# Patient Record
Sex: Male | Born: 1970
Health system: Southern US, Community
[De-identification: ages and names within clinical notes are randomized; demographics above are authoritative.]

## PROBLEM LIST (undated history)

## (undated) DIAGNOSIS — E119 Type 2 diabetes mellitus without complications: Secondary | ICD-10-CM

## (undated) DIAGNOSIS — H269 Unspecified cataract: Secondary | ICD-10-CM

## (undated) DIAGNOSIS — I10 Essential (primary) hypertension: Secondary | ICD-10-CM

## (undated) HISTORY — DX: Unspecified cataract: H26.9

## (undated) HISTORY — PX: CATARACT EXTRACTION: SUR2

---

## 1998-07-27 ENCOUNTER — Encounter: Payer: Self-pay | Admitting: Family Medicine

## 1998-07-27 ENCOUNTER — Ambulatory Visit (HOSPITAL_COMMUNITY): Admission: RE | Admit: 1998-07-27 | Discharge: 1998-07-27 | Payer: Self-pay | Admitting: Family Medicine

## 1999-03-18 ENCOUNTER — Emergency Department (HOSPITAL_COMMUNITY): Admission: EM | Admit: 1999-03-18 | Discharge: 1999-03-18 | Payer: Self-pay | Admitting: Emergency Medicine

## 2003-09-13 ENCOUNTER — Emergency Department (HOSPITAL_COMMUNITY): Admission: EM | Admit: 2003-09-13 | Discharge: 2003-09-13 | Payer: Self-pay | Admitting: Emergency Medicine

## 2008-01-16 ENCOUNTER — Emergency Department (HOSPITAL_COMMUNITY): Admission: EM | Admit: 2008-01-16 | Discharge: 2008-01-16 | Payer: Self-pay | Admitting: Emergency Medicine

## 2012-02-18 ENCOUNTER — Emergency Department (INDEPENDENT_AMBULATORY_CARE_PROVIDER_SITE_OTHER)
Admission: EM | Admit: 2012-02-18 | Discharge: 2012-02-18 | Disposition: A | Discharge status: Home / Self Care | Payer: Self-pay | Source: Home / Self Care | Attending: Family Medicine | Admitting: Family Medicine

## 2012-02-18 ENCOUNTER — Encounter (HOSPITAL_COMMUNITY): Payer: Self-pay | Admitting: *Deleted

## 2012-02-18 DIAGNOSIS — L309 Dermatitis, unspecified: Secondary | ICD-10-CM

## 2012-02-18 DIAGNOSIS — L259 Unspecified contact dermatitis, unspecified cause: Secondary | ICD-10-CM

## 2012-02-18 DIAGNOSIS — R209 Unspecified disturbances of skin sensation: Secondary | ICD-10-CM

## 2012-02-18 LAB — GLUCOSE, CAPILLARY: Glucose-Capillary: 145 mg/dL — ABNORMAL HIGH (ref 70–99)

## 2012-02-18 MED ORDER — TRIAMCINOLONE 0.1 % CREAM:EUCERIN CREAM 1:1: 1.0000 "application " | TOPICAL_CREAM | Freq: Two times a day (BID) | CUTANEOUS | Status: DC

## 2012-02-18 MED ORDER — TRIAMCINOLONE ACETONIDE 0.5 % EX OINT: TOPICAL_OINTMENT | Freq: Two times a day (BID) | CUTANEOUS | Status: DC

## 2012-02-18 MED ORDER — HYDROXYZINE HCL 25 MG PO TABS: 25.0000 mg | ORAL_TABLET | Freq: Four times a day (QID) | ORAL | Status: DC

## 2012-02-18 NOTE — ED Provider Notes (Signed)
History     CSN: [id number]  Arrival date & time 02/18/12  1224   First MD Initiated Contact with Patient 02/18/12 1320      Chief Complaint  Patient presents with  . Rash    (Consider location/radiation/quality/duration/timing/severity/associated sxs/prior treatment) HPI Comments: 41 y/o male with h/o obesity otherwise denies known PMH. Here c/o generalized body rash with itchiness and dry skin for months. Rash worse in arms upper thighs and abdomen. Has taken benadryl with no significant relief. Denies fatigue or general malaise, no fever, chills or night sweats. Also denies constipation, diarrhea or abdominal pain.  Denies h/o asthma or wheezing. Reports he has noted intermittent tingling and numbing sensation at the tip of fingers in both hands. States he currently does not have tingling or numbness in hands. Denies history of extremity weakness. Patient reports he has seen a PCP in the past and has never been diagnosed with diabetes, has not seen PCP in years. POC CBG random today is in 140's range. Denies polyuria, polydipsia or polyphagia.    History reviewed. No pertinent past medical history.  History reviewed. No pertinent past surgical history.  Family History  Problem Relation Age of Onset  . Diabetes Mother   . Hypertension Mother     History  Substance Use Topics  . Smoking status: Current Every Day Smoker  . Smokeless tobacco: Not on file  . Alcohol Use: Yes      Review of Systems  Constitutional: Negative for fever, chills, diaphoresis, appetite change and fatigue.       10 systems reviewed and  pertinent negative and positive symptoms are as per HPI.     HENT: Negative for congestion and rhinorrhea.   Eyes: Negative for pain.  Respiratory: Negative for shortness of breath and wheezing.   Gastrointestinal: Negative for nausea, vomiting, abdominal pain, diarrhea and constipation.  Skin: Positive for rash.       As per HPI  Neurological: Negative for  dizziness, seizures, weakness, light-headedness and headaches.  Psychiatric/Behavioral: The patient is not nervous/anxious.   All other systems reviewed and are negative.    Allergies  Review of patient's allergies indicates not on file.  Home Medications   Current Outpatient Rx  Name Route Sig Dispense Refill  . HYDROXYZINE HCL 25 MG PO TABS Oral Take 1 tablet (25 mg total) by mouth every 6 (six) hours. 12 tablet 0  . TRIAMCINOLONE 0.1 % CREAM:EUCERIN CREAM 1:1 Topical Apply 1 application topically 2 (two) times daily. Apply as instructed twice a day when necessary for eczema flareups.  triamcinolone 0.1% compounded with use during green 1:1 Dispense 450 g 1 each 0  . TRIAMCINOLONE ACETONIDE 0.5 % EX OINT Topical Apply topically 2 (two) times daily. 30 g 0    BP 137/77  Pulse 74  Temp 98.1 F (36.7 C) (Oral)  Resp 20  SpO2 100%  Physical Exam  Nursing note and vitals reviewed. Constitutional: He is oriented to person, place, and time. No distress.       Morbidly obese  HENT:  Head: Normocephalic and atraumatic.  Mouth/Throat: Oropharynx is clear and moist.  Eyes: Conjunctivae normal are normal. No scleral icterus.  Neck:       Obese neck no obvious thyromegaly.  Cardiovascular: Normal rate and regular rhythm.   Pulmonary/Chest: Breath sounds normal. He has no wheezes.  Abdominal: Soft. Bowel sounds are normal. There is no tenderness.  Lymphadenopathy:    He has no cervical adenopathy.    He has  no axillary adenopathy.       Right: No inguinal and no supraclavicular adenopathy present.       Left: No inguinal and no supraclavicular adenopathy present.  Neurological: He is alert and oriented to person, place, and time.  Skin: He is not diaphoretic.       Generalized dry skin with skin thickening and slight hyperpigmentation and scratch marks in arms and anterior abdomen. Also dry peeling skin in upper inner thighs. No pustules, papules or burroughs.   Psychiatric: He  has a normal mood and affect. His behavior is normal. Judgment and thought content normal.    ED Course  Procedures (including critical care time)  Labs Reviewed  GLUCOSE, CAPILLARY - Abnormal; Notable for the following:    Glucose-Capillary 145 (*)     All other components within normal limits   No results found.   1. Eczema   2. Paresthesias/numbness       MDM  Impress eczema. Given association with acro paresthesias, discussed with patient need to have a work up to rule out nutritional deficiencies or chronic conditions like diabetes, thyroid disease etc. Treated with triamcinolone/Eucerin cream, hydroxyzine and otc poly vitamine supplement. Patient will follow up with PCP to monitor symptoms and possible work up as out patient.         Sharin Grave, MD 02/19/12 1050

## 2012-02-18 NOTE — ED Notes (Signed)
Pt  Has  Symptoms  Of  A   Rash  That  Itches          For  sev  Months  That  Is  unreleived  By  Benadryl  Or  Creams     He  denys  Any  Known  Cay=usative  Agents  Or  Any  New  meds -  He  Also  Report s  That  The  fingertios  Of  Both  Hands  Have  Been  Numb  Over  The  Last  Several  Weeks     He  denys  Any  specefic  Injury

## 2014-05-05 ENCOUNTER — Encounter (HOSPITAL_COMMUNITY): Payer: Self-pay | Admitting: *Deleted

## 2014-05-05 ENCOUNTER — Emergency Department (INDEPENDENT_AMBULATORY_CARE_PROVIDER_SITE_OTHER)
Admission: EM | Admit: 2014-05-05 | Discharge: 2014-05-05 | Disposition: A | Discharge status: Home / Self Care | Payer: 59 | Source: Home / Self Care | Attending: Family Medicine | Admitting: Family Medicine

## 2014-05-05 DIAGNOSIS — R002 Palpitations: Secondary | ICD-10-CM

## 2014-05-05 NOTE — ED Provider Notes (Signed)
CSN: [id number]     Arrival date & time 05/05/14  1514 History   None    Chief Complaint  Patient presents with  . Tachycardia   (Consider location/radiation/quality/duration/timing/severity/associated sxs/prior Treatment) HPI Comments: Patient states he was startled awake around 530 am this morning with the sense of tachypalpitations and felt the need to take deep inspirations. Denies chest pain, diaphoresis, nausea, cough, vomiting. States he decided not to go to work and dozed off back to sleep. Woke on a few additional occasions over the course of the morning and sense of rapid heartbeat remained. States he finally woke up to get out of bed at around noon and discovered that palpitations had resolved. Later in the afternoon he had a conversation with his mother who encouraged him to seek medical evaluation. At the time of his evaluation at [facility], patient reports himself to be symptom free.  Is a smoker and drinks ETOH socially. Work in office of [employer] Denies caffeine or illicit drug use. PCP: none    The history is provided by the patient.    History reviewed. No pertinent past medical history. History reviewed. No pertinent past surgical history. Family History  Problem Relation Age of Onset  . Diabetes Mother   . Hypertension Mother    History  Substance Use Topics  . Smoking status: Current Every Day Smoker  . Smokeless tobacco: Not on file  . Alcohol Use: Yes    Review of Systems  All other systems reviewed and are negative.   Allergies  Review of patient's allergies indicates no known allergies.  Home Medications   Prior to Admission medications   Medication Sig Start Date End Date Taking? Authorizing Provider  hydrOXYzine (ATARAX/VISTARIL) 25 MG tablet Take 1 tablet (25 mg total) by mouth every 6 (six) hours. 02/18/12   Adlih Moreno-Coll, MD  Triamcinolone Acetonide (TRIAMCINOLONE 0.1 % CREAM : EUCERIN) CREA Apply 1 application topically 2 (two)  times daily. Apply as instructed twice a day when necessary for eczema flareups.  triamcinolone 0.1% compounded with use during green 1:1 Dispense 450 g 02/18/12   Adlih Moreno-Coll, MD  triamcinolone ointment (KENALOG) 0.5 % Apply topically 2 (two) times daily. 02/18/12   Adlih Moreno-Coll, MD   BP 139/92 mmHg  Pulse 91  Temp(Src) 98 F (36.7 C) (Oral)  Resp 16  SpO2 97% Physical Exam  Constitutional: He is oriented to person, place, and time. He appears well-developed and well-nourished. No distress.  +obese  HENT:  Head: Normocephalic and atraumatic.  Mouth/Throat: Oropharynx is clear and moist.  Eyes: Conjunctivae are normal.  Neck: Normal range of motion. Neck supple. No JVD present.  Cardiovascular: Normal rate, regular rhythm and normal heart sounds.   Pulmonary/Chest: Effort normal and breath sounds normal. No respiratory distress. He has no wheezes. He exhibits no tenderness.  Abdominal: Soft. Bowel sounds are normal. He exhibits no distension. There is no tenderness.  Musculoskeletal: Normal range of motion.  Neurological: He is alert and oriented to person, place, and time.  Skin: Skin is warm and dry. He is not diaphoretic.  Psychiatric: He has a normal mood and affect. His behavior is normal.  Nursing note and vitals reviewed.   ED Course  Procedures (including critical care time) Labs Review Labs Reviewed - No data to display  Imaging Review No results found.   MDM   1. Heart palpitations    ECG: NSR @ 93 bpm with nonspecific T wave abnormalities. No ectopy. No previous ECG  available for comparison. Case discussed with and ECG reviewed via telephone consultation [facility] (Dr. Excell Seltzerooper). ECG determined to be grossly normal. Dr. Excell Seltzerooper advised that it would be appropriate to discharge patient home. Dr. Excell Seltzerooper stated he would contact his office and ask office to call patient to set up office based follow up appointment for  continued evaluation of palpitations and possible Holter Monitor placement. Patient voices understanding that if symptoms reoccur, become suddenly worse or severe, or become associated with dyspnea or chest pain, he is to seek immediate re-evaluation at his nearest ER.     Ria ClockJennifer Lee H Presson, [place] 05/05/14 1710

## 2014-05-05 NOTE — Discharge Instructions (Signed)
Expect a [facility] from [facility] for a follow up appointment. If symptoms reoccur, please report to your nearest ER for immediate evaluation.  Palpitations A palpitation is the feeling that your heartbeat is irregular or is faster than normal. It may feel like your heart is fluttering or skipping a beat. Palpitations are usually not a serious problem. However, in some cases, you may need further medical evaluation. CAUSES  Palpitations can be caused by:  Smoking.  Caffeine or other stimulants, such as diet pills or energy drinks.  Alcohol.  Stress and anxiety.  Strenuous physical activity.  Fatigue.  Certain medicines.  Heart disease, especially if you have a history of irregular heart rhythms (arrhythmias), such as atrial fibrillation, atrial flutter, or supraventricular tachycardia.  An improperly working pacemaker or defibrillator. DIAGNOSIS  To find the cause of your palpitations, your health care provider will take your medical history and perform a physical exam. Your health care provider may also have you take a test called an ambulatory electrocardiogram (ECG). An ECG records your heartbeat patterns over a 24-hour period. You may also have other tests, such as:  Transthoracic echocardiogram (TTE). During echocardiography, sound waves are used to evaluate how blood flows through your heart.  Transesophageal echocardiogram (TEE).  Cardiac monitoring. This allows your health care provider to monitor your heart rate and rhythm in real time.  Holter monitor. This is a portable device that records your heartbeat and can help diagnose heart arrhythmias. It allows your health care provider to track your heart activity for several days, if needed.  Stress tests by exercise or by giving medicine that makes the heart beat faster. TREATMENT  Treatment of palpitations depends on the cause of your symptoms and can vary greatly. Most cases of palpitations do not require any treatment  other than time, relaxation, and monitoring your symptoms. Other causes, such as atrial fibrillation, atrial flutter, or supraventricular tachycardia, usually require further treatment. HOME CARE INSTRUCTIONS   Avoid:  Caffeinated coffee, tea, soft drinks, diet pills, and energy drinks.  Chocolate.  Alcohol.  Stop smoking if you smoke.  Reduce your stress and anxiety. Things that can help you relax include:  A method of controlling things in your body, such as your heartbeats, with your mind (biofeedback).  Yoga.  Meditation.  Physical activity such as swimming, jogging, or walking.  Get plenty of rest and sleep. SEEK MEDICAL CARE IF:   You continue to have a fast or irregular heartbeat beyond 24 hours.  Your palpitations occur more often. SEEK IMMEDIATE MEDICAL CARE IF:  You have chest pain or shortness of breath.  You have a severe headache.  You feel dizzy or you faint. MAKE SURE YOU:  Understand these instructions.  Will watch your condition.  Will get help right away if you are not doing well or get worse. Document Released: 04/13/2000 Document Revised: 04/21/2013 Document Reviewed: 06/15/2011 [facility] Patient Information 2015 [place] [place]. This information is not intended to replace advice given to you by your health care provider. Make sure you discuss any questions you have with your health care provider.

## 2014-05-05 NOTE — ED Notes (Signed)
Pt  Reports   He  Had  A  Bad  Dream  Last     Pm  And   He awoke  With  A    Rapid  Heartbeat    And  Somewhat  Short  Of   Breath     He  denys  Any  Chest  Pain  At this  Time  He  Is  Awake  And  Alert and is  Sitting upright on exam table  In no  Acute  Distress

## 2014-05-07 ENCOUNTER — Other Ambulatory Visit: Payer: Self-pay

## 2014-05-07 DIAGNOSIS — R002 Palpitations: Secondary | ICD-10-CM

## 2014-05-21 ENCOUNTER — Encounter: Payer: 59 | Admitting: Interventional Cardiology

## 2016-04-30 DIAGNOSIS — E119 Type 2 diabetes mellitus without complications: Secondary | ICD-10-CM

## 2016-04-30 HISTORY — DX: Type 2 diabetes mellitus without complications: E11.9

## 2016-10-11 DIAGNOSIS — E1165 Type 2 diabetes mellitus with hyperglycemia: Secondary | ICD-10-CM | POA: Diagnosis not present

## 2016-10-11 DIAGNOSIS — Z0000 Encounter for general adult medical examination without abnormal findings: Secondary | ICD-10-CM | POA: Diagnosis not present

## 2016-10-11 DIAGNOSIS — H538 Other visual disturbances: Secondary | ICD-10-CM | POA: Diagnosis not present

## 2016-10-11 DIAGNOSIS — Z136 Encounter for screening for cardiovascular disorders: Secondary | ICD-10-CM | POA: Diagnosis not present

## 2016-10-11 DIAGNOSIS — Z01118 Encounter for examination of ears and hearing with other abnormal findings: Secondary | ICD-10-CM | POA: Diagnosis not present

## 2016-10-11 DIAGNOSIS — I10 Essential (primary) hypertension: Secondary | ICD-10-CM | POA: Diagnosis not present

## 2016-10-11 DIAGNOSIS — Z720 Tobacco use: Secondary | ICD-10-CM | POA: Diagnosis not present

## 2016-11-09 DIAGNOSIS — I10 Essential (primary) hypertension: Secondary | ICD-10-CM | POA: Diagnosis not present

## 2017-01-08 ENCOUNTER — Encounter (HOSPITAL_COMMUNITY): Payer: Self-pay | Admitting: Emergency Medicine

## 2017-01-08 ENCOUNTER — Ambulatory Visit (HOSPITAL_COMMUNITY)
Admission: EM | Admit: 2017-01-08 | Discharge: 2017-01-08 | Disposition: A | Discharge status: Home / Self Care | Payer: 59 | Attending: Emergency Medicine | Admitting: Emergency Medicine

## 2017-01-08 DIAGNOSIS — R51 Headache: Secondary | ICD-10-CM | POA: Diagnosis not present

## 2017-01-08 DIAGNOSIS — G894 Chronic pain syndrome: Secondary | ICD-10-CM

## 2017-01-08 DIAGNOSIS — M25561 Pain in right knee: Secondary | ICD-10-CM | POA: Diagnosis not present

## 2017-01-08 DIAGNOSIS — R519 Headache, unspecified: Secondary | ICD-10-CM

## 2017-01-08 DIAGNOSIS — G8929 Other chronic pain: Secondary | ICD-10-CM

## 2017-01-08 HISTORY — DX: Type 2 diabetes mellitus without complications: E11.9

## 2017-01-08 MED ORDER — NAPROXEN 375 MG PO TABS: 375.0000 mg | ORAL_TABLET | Freq: Two times a day (BID) | ORAL | 0 refills | Status: DC

## 2017-01-08 MED ORDER — KETOROLAC TROMETHAMINE 30 MG/ML IJ SOLN
INTRAMUSCULAR | Status: AC
  Filled 2017-01-08: qty 1

## 2017-01-08 MED ORDER — FLUTICASONE PROPIONATE 50 MCG/ACT NA SUSP: 2.0000 | Freq: Every day | NASAL | 0 refills | Status: DC

## 2017-01-08 MED ORDER — KETOROLAC TROMETHAMINE 30 MG/ML IJ SOLN
30.0000 mg | Freq: Once | INTRAMUSCULAR | Status: AC
  Administered 2017-01-08: 30 mg via INTRAMUSCULAR

## 2017-01-08 NOTE — Discharge Instructions (Signed)
Start flonase, continue Claritin for nasal congestion. You can use over the counter nasal saline rinse such as neti pot for nasal congestion. Keep hydrated, your urine should be clear to pale yellow in color. Tylenol/motrin for fever and pain. Monitor for any worsening of symptoms, chest pain, shortness of breath, wheezing, swelling of the throat, follow up for reevaluation.   You were given a toradol injection in office today. Take naproxen as directed for headache/muscle strain and right knee pain. Wear knee sleeve during work. Elevation and ice compress. Follow up with PCP for further evaluation of chronic right knee pain.

## 2017-01-08 NOTE — ED Provider Notes (Signed)
[facility]    CSN: [id number] Arrival date & time: 01/08/17  1007     History   Chief Complaint Chief Complaint  Patient presents with  . Headache  . Knee Pain    HPI Bary Limbach is a 46 y.o. male.   46 year old male with history of diabetes comes in for multiple complaints.  1. 3 week history of intermittent headache. Headache is frontal in nature, described as pressure. Denies nausea, vomiting, photophobia, phonophobia. States that pain has also extended over his head and down to his right neck and shoulders. He has noticed some nasal congestion and rhinorrhea. Denies any aggravating factor. Steak and some Goody powder with good relief. Denies chest pain, shortness of breath, vision changes, weakness, dizziness. Denies fever, cough, sore throat, ear pain, eye pain. Denies sick contact. He recently started a new job, with more physical activity, he wonders if his right shoulder pain could be due to that.  2. Chronic right knee pain. Patient states that he sprained knee in the past, and since then had intermittent right knee swelling with pain that lasts for a few days, no results on own. Most recent episode a week ago, but has since resolved. He is never been fully evaluated for his knee, stating it goes away on its own. Denies numbness, tingling, new injury. He is able to ambulate without problem.      Past Medical History:  Diagnosis Date  . Diabetes mellitus without complication (HCC)     There are no active problems to display for this patient.   History reviewed. No pertinent surgical history.     Home Medications    Prior to Admission medications   Medication Sig Start Date End Date Taking? Authorizing Provider  insulin glargine (LANTUS) 100 UNIT/ML injection Inject 25 Units into the skin at bedtime.   Yes [provider]  metFORMIN (GLUCOPHAGE) 1000 MG tablet Take 500 mg by mouth 2 (two) times daily with a meal.   Yes [provider]  fluticasone (FLONASE) 50 MCG/ACT nasal spray Place 2 sprays into both nostrils daily. 01/08/17   Cathie Hoops, Amy V, PA-C  naproxen (NAPROSYN) 375 MG tablet Take 1 tablet (375 mg total) by mouth 2 (two) times daily. 01/08/17   Belinda Fisher, PA-C    Family History Family History  Problem Relation Age of Onset  . Diabetes Mother   . Hypertension Mother     Social History Social History  Substance Use Topics  . Smoking status: Current Every Day Smoker    Packs/day: 0.25    Types: Cigarettes  . Smokeless tobacco: Never Used  . Alcohol use Yes     Allergies   Patient has no known allergies.   Review of Systems Review of Systems  Reason unable to perform ROS: See HPI as above.     Physical Exam Triage Vital Signs ED Triage Vitals  Enc Vitals Group     BP 01/08/17 1052 (!) 163/93     Pulse Rate 01/08/17 1052 69     Resp 01/08/17 1052 16     Temp 01/08/17 1052 98.5 F (36.9 C)     Temp Source 01/08/17 1052 Oral     SpO2 01/08/17 1052 98 %     Weight 01/08/17 1052 300 lb (136.1 kg)     Height 01/08/17 1052  (1.88 m)     Head Circumference --      Peak Flow --      Pain Score  01/08/17 1053 5     Pain Loc --      Pain Edu? --      Excl. in GC? --    No data found.   Updated Vital Signs BP (!) 163/93   Pulse 69   Temp 98.5 F (36.9 C) (Oral)   Resp 16   Ht 6\' 2"  (1.88 m)   Wt 300 lb (136.1 kg)   SpO2 98%   BMI 38.52 kg/m   Physical Exam  Constitutional: He is oriented to person, place, and time. He appears well-developed and well-nourished. No distress.  HENT:  Head: Normocephalic and atraumatic.  Right Ear: Tympanic membrane, external ear and ear canal normal. Tympanic membrane is not erythematous and not bulging.  Left Ear: Tympanic membrane, external ear and ear canal normal. Tympanic membrane is not erythematous and not bulging.  Nose: Mucosal edema and rhinorrhea present. Right sinus exhibits no maxillary sinus tenderness and no frontal sinus  tenderness. Left sinus exhibits no maxillary sinus tenderness and no frontal sinus tenderness.  Mouth/Throat: Uvula is midline, oropharynx is clear and moist and mucous membranes are normal.  Eyes: Pupils are equal, round, and reactive to light. Conjunctivae and EOM are normal.  Neck: Normal range of motion. Neck supple. No spinous process tenderness and no muscular tenderness present. Normal range of motion present.  Cardiovascular: Normal rate, regular rhythm and normal heart sounds.  Exam reveals no gallop and no friction rub.   No murmur heard. Pulmonary/Chest: Effort normal and breath sounds normal. He has no decreased breath sounds. He has no wheezes. He has no rhonchi. He has no rales.  Musculoskeletal:  Tenderness on palpation of the right shoulder. Full range of motion, strength normal and equal bilaterally. Sensation intact and equal bilaterally. Radial pulses 2+ and equal bilaterally.  No obvious swelling/effusion of the right knee. No tenderness on palpation. Full range of motion, strength normal and equal bilaterally. Sensation intact and equal bilaterally.  Lymphadenopathy:    He has no cervical adenopathy.  Neurological: He is alert and oriented to person, place, and time.  Skin: Skin is warm and dry.  Psychiatric: He has a normal mood and affect. His behavior is normal. Judgment normal.     UC Treatments / Results  Labs (all labs ordered are listed, but only abnormal results are displayed) Labs Reviewed - No data to display  EKG  EKG Interpretation None       Radiology No results found.  Procedures Procedures (including critical care time)  Medications Ordered in UC Medications  ketorolac (TORADOL) 30 MG/ML injection 30 mg (30 mg Intramuscular Given 01/08/17 1118)     Initial Impression / Assessment and Plan / UC Course  I have reviewed the triage vital signs and the nursing notes.  Pertinent labs & imaging results that were available during my care of the  patient were reviewed by me and considered in my medical decision making (see chart for details).    Given no significant sinus tenderness, will treat for nasal congestion from exam. Patient to start symptomatic treatment. Discussed with patient right shoulder pain, exacerbation of chronic right knee pain could be due to recent change in work with increased physical activity. Start naproxen as directed for muscle strain, knee pain, headache. Knee sleeve provided, patient to use during activity. Ice compress and elevation. Patient to follow-up with PCP for further evaluation and treatment. Return precautions given.   Final Clinical Impressions(s) / UC Diagnoses   Final diagnoses:  Acute intractable  headache, unspecified headache type  Chronic pain of right knee    New Prescriptions New Prescriptions   FLUTICASONE (FLONASE) 50 MCG/ACT NASAL SPRAY    Place 2 sprays into both nostrils daily.   NAPROXEN (NAPROSYN) 375 MG TABLET    Take 1 tablet (375 mg total) by mouth 2 (two) times daily.       Belinda Fisher, PA-C 01/08/17 1119

## 2017-01-08 NOTE — ED Triage Notes (Signed)
PT reports headache over right forehead that extends down into neck. PT reports he has been congested as well and wonders if it's related to sinuses. Headache for 3 weeks  PT reports chronic pain in right knee and believes he may have fluid on the joint.

## 2017-03-04 ENCOUNTER — Encounter (HOSPITAL_COMMUNITY): Payer: Self-pay

## 2017-03-04 ENCOUNTER — Emergency Department (HOSPITAL_COMMUNITY)
Admission: EM | Admit: 2017-03-04 | Discharge: 2017-03-04 | Disposition: A | Discharge status: Home / Self Care | Payer: 59 | Attending: Emergency Medicine | Admitting: Emergency Medicine

## 2017-03-04 ENCOUNTER — Emergency Department (HOSPITAL_COMMUNITY): Payer: 59

## 2017-03-04 DIAGNOSIS — E119 Type 2 diabetes mellitus without complications: Secondary | ICD-10-CM | POA: Diagnosis not present

## 2017-03-04 DIAGNOSIS — M25461 Effusion, right knee: Secondary | ICD-10-CM | POA: Insufficient documentation

## 2017-03-04 DIAGNOSIS — Y999 Unspecified external cause status: Secondary | ICD-10-CM | POA: Diagnosis not present

## 2017-03-04 DIAGNOSIS — M1711 Unilateral primary osteoarthritis, right knee: Secondary | ICD-10-CM | POA: Diagnosis not present

## 2017-03-04 DIAGNOSIS — M25561 Pain in right knee: Secondary | ICD-10-CM | POA: Diagnosis not present

## 2017-03-04 DIAGNOSIS — Z79899 Other long term (current) drug therapy: Secondary | ICD-10-CM | POA: Diagnosis not present

## 2017-03-04 DIAGNOSIS — M25562 Pain in left knee: Secondary | ICD-10-CM | POA: Diagnosis not present

## 2017-03-04 DIAGNOSIS — F17210 Nicotine dependence, cigarettes, uncomplicated: Secondary | ICD-10-CM | POA: Diagnosis not present

## 2017-03-04 DIAGNOSIS — Y939 Activity, unspecified: Secondary | ICD-10-CM | POA: Diagnosis not present

## 2017-03-04 DIAGNOSIS — Z794 Long term (current) use of insulin: Secondary | ICD-10-CM | POA: Insufficient documentation

## 2017-03-04 DIAGNOSIS — W010XXA Fall on same level from slipping, tripping and stumbling without subsequent striking against object, initial encounter: Secondary | ICD-10-CM | POA: Diagnosis not present

## 2017-03-04 DIAGNOSIS — Y929 Unspecified place or not applicable: Secondary | ICD-10-CM | POA: Diagnosis not present

## 2017-03-04 MED ORDER — IBUPROFEN 400 MG PO TABS
600.0000 mg | ORAL_TABLET | Freq: Once | ORAL | Status: AC
  Administered 2017-03-04: 600 mg via ORAL
  Filled 2017-03-04: qty 1

## 2017-03-04 NOTE — ED Notes (Signed)
Pt stable, ambulatory, states understanding of discharge instructions 

## 2017-03-04 NOTE — ED Triage Notes (Signed)
Pt arrived from home c/o right knee pain and swelling d/t fall Saturday.

## 2017-03-04 NOTE — Discharge Instructions (Signed)
Ibuprofen and Tylenol for pain.  Use knee brace for support and crutches to help stay off of the knee, keep knee elevated and use ice.  Please follow-up with orthopedics in 1 week, as well as your primary doctor.  If you develop worsening pain not controlled by medications, warmth, redness, fevers or chills or other concerning symptoms please return to the emergency department for sooner evaluation.

## 2017-03-04 NOTE — ED Notes (Signed)
Ortho tech paged  

## 2017-03-04 NOTE — ED Provider Notes (Signed)
[facility] EMERGENCY DEPARTMENT Provider Note   CSN: [id number] Arrival date & time: 03/04/17  0909     History   Chief Complaint Chief Complaint  Patient presents with  . Knee Injury    HPI  Gabriel Cook is a 46 y.o. Male history of diabetes, presents with pain in his right knee after he fell on it on Saturday.  Patient reports he tripped and went down on the knee, denies hitting his head or any other injuries from the fall.  Reports since then he has had pain in the knee over the medial aspect with some swelling, patient reports pain is a dull ache, that is worse when he is trying to get up to walk on it.  Denies any numbness or tingling distal to the injury.  No pain in the hip or ankle.  No scrapes or abrasions to the knee, denies any fevers or chills, warmth or redness of the knee.  Has tried elevating at home, no ice or medications prior to arrival.  She does report he had strained his knee several years ago and occasionally has some pain from that, but no previous surgeries to the knee.       Past Medical History:  Diagnosis Date  . Diabetes mellitus without complication (HCC)     There are no active problems to display for this patient.   History reviewed. No pertinent surgical history.     Home Medications    Prior to Admission medications   Medication Sig Start Date End Date Taking? Authorizing Provider  fluticasone (FLONASE) 50 MCG/ACT nasal spray Place 2 sprays into both nostrils daily. 01/08/17   Cathie Hoops, Amy V, PA-C  insulin glargine (LANTUS) 100 UNIT/ML injection Inject 25 Units into the skin at bedtime.    [provider]  metFORMIN (GLUCOPHAGE) 1000 MG tablet Take 500 mg by mouth 2 (two) times daily with a meal.    [provider]  naproxen (NAPROSYN) 375 MG tablet Take 1 tablet (375 mg total) by mouth 2 (two) times daily. 01/08/17   Belinda Fisher, PA-C    Family History Family History  Problem Relation Age of Onset  .  Diabetes Mother   . Hypertension Mother     Social History Social History   Tobacco Use  . Smoking status: Current Every Day Smoker    Packs/day: 0.25    Types: Cigarettes  . Smokeless tobacco: Never Used  Substance Use Topics  . Alcohol use: Yes  . Drug use: No     Allergies   Patient has no known allergies.   Review of Systems Review of Systems  Constitutional: Negative for chills and fever.  Musculoskeletal: Positive for arthralgias (R knee) and joint swelling.  Skin: Negative for color change, rash and wound.  Neurological: Negative for weakness and numbness.     Physical Exam Updated Vital Signs Ht 6' 2.5" (1.892 m)   Wt 136.1 kg (300 lb)   BMI 38.00 kg/m   Physical Exam  Constitutional: He is oriented to person, place, and time. He appears well-developed and well-nourished. No distress.  HENT:  Head: Normocephalic and atraumatic.  Eyes: Right eye exhibits no discharge. Left eye exhibits no discharge.  Pulmonary/Chest: Effort normal. No respiratory distress.  Musculoskeletal:  Swelling over the medial aspect of the right knee with mild tenderness to palpation, no redness or warmth, flexion and extension of the knee is limited by pain and swelling, patient able to bear weight on the knee  but with discomfort, walks with a limp.  Normal hip and ankle exam on the right, distal pulses 2+ with good capillary refill, distal sensation intact and no pain distally.  Patient able to flex and extend the knee against resistance 5/5, and normal plantar and dorsiflexion strength  Neurological: He is alert and oriented to person, place, and time. Coordination normal.  Skin: Skin is warm and dry. Capillary refill takes less than 2 seconds. No rash noted. He is not diaphoretic. No erythema.  Psychiatric: He has a normal mood and affect. His behavior is normal.  Nursing note and vitals reviewed.    ED Treatments / Results  Labs (all labs ordered are listed, but only abnormal  results are displayed) Labs Reviewed - No data to display  EKG  EKG Interpretation None       Radiology Dg Knee Complete 4 Views Right  Result Date: 03/04/2017 CLINICAL DATA:  The patient's right knee gave way while walking 03/02/2017. EXAM: RIGHT KNEE - COMPLETE 4+ VIEW COMPARISON:  None. FINDINGS: No acute bony or joint abnormality is identified. Markedly advanced for age tricompartmental osteoarthritis is present with bulky osteophytosis about all 3 compartments and joint space narrowing. Moderate glenohumeral joint effusion is seen. IMPRESSION: Negative for fracture or other acute abnormality. Markedly advanced for age tricompartmental osteoarthritis. Associated joint effusion noted. Electronically Signed   By: Drusilla Kannerhomas  Dalessio M.D.   On: 03/04/2017 11:12    Procedures Procedures (including critical care time)  Medications Ordered in ED Medications  ibuprofen (ADVIL,MOTRIN) tablet 600 mg (600 mg Oral Given 03/04/17 1051)    Initial Impression / Assessment and Plan / ED Course  I have reviewed the triage vital signs and the nursing notes.  Pertinent labs & imaging results that were available during my care of the patient were reviewed by me and considered in my medical decision making (see chart for details).  Patient presents with right knee pain and swelling after he fell and landed on it on Saturday, patient also reports some chronic knee issues after an injury 20 years ago.  Medial swelling with tenderness to palpation, no redness or warmth, patient is able to bend and flex some, ROM limited by pain.  Patient is neurovascularly intact.  X-ray negative for acute fractures, there is osteoarthritis present advanced for his age.  Will place patient in knee sleeve and provide crutches.  Ibuprofen and Tylenol for pain management.  Patient is to follow-up with orthopedics in 1 week, as well as his primary doctor.  Return precautions discussed.  Patient expresses understanding and is in  agreement with plan.   Final Clinical Impressions(s) / ED Diagnoses   Final diagnoses:  Right knee pain, unspecified chronicity  Osteoarthritis of right knee, unspecified osteoarthritis type  Effusion of right knee    ED Discharge Orders    None       Legrand RamsFord, Kaysey Berndt N, PA-C 03/04/17 2017    Melene PlanFloyd, Dan, DO 03/05/17 [phone]

## 2017-03-04 NOTE — Progress Notes (Signed)
Orthopedic Tech Progress Note Patient Details:  Gabriel Cook 10-18-1970 [id number]  Ortho Devices Type of Ortho Device: Knee Sleeve, Crutches Ortho Device/Splint Location: Left Knee Ortho Device/Splint Interventions: Application, Adjustment   Gabriel Cook, Gabriel Cook 03/04/2017, 11:44 AM

## 2017-03-15 DIAGNOSIS — M25561 Pain in right knee: Secondary | ICD-10-CM | POA: Diagnosis not present

## 2017-05-15 ENCOUNTER — Other Ambulatory Visit: Payer: Self-pay

## 2017-05-15 ENCOUNTER — Encounter (HOSPITAL_COMMUNITY): Payer: Self-pay | Admitting: Emergency Medicine

## 2017-05-15 ENCOUNTER — Ambulatory Visit (HOSPITAL_COMMUNITY)
Admission: EM | Admit: 2017-05-15 | Discharge: 2017-05-15 | Disposition: A | Discharge status: Home / Self Care | Payer: 59 | Attending: Family Medicine | Admitting: Family Medicine

## 2017-05-15 DIAGNOSIS — M7061 Trochanteric bursitis, right hip: Secondary | ICD-10-CM

## 2017-05-15 DIAGNOSIS — M25551 Pain in right hip: Secondary | ICD-10-CM | POA: Diagnosis not present

## 2017-05-15 MED ORDER — DICLOFENAC SODIUM 75 MG PO TBEC: 75.0000 mg | DELAYED_RELEASE_TABLET | Freq: Two times a day (BID) | ORAL | 0 refills | Status: DC

## 2017-05-15 MED ORDER — PREDNISONE 10 MG (21) PO TBPK: ORAL_TABLET | ORAL | 0 refills | Status: DC

## 2017-05-15 NOTE — ED Triage Notes (Signed)
Right hip pain, intermittent for a month.  Since Sunday, pain in right hip is extreme.  No fall, no known injury

## 2017-05-18 NOTE — ED Provider Notes (Signed)
  [facility]   [id number] 05/15/17 Arrival Time: 1702  ASSESSMENT & PLAN:  1. Right hip pain   2. Trochanteric bursitis of right hip     Meds ordered this encounter  Medications  . predniSONE (STERAPRED UNI-PAK 21 TAB) 10 MG (21) TBPK tablet    Sig: Take as directed.    Dispense:  21 tablet    Refill:  0  . diclofenac (VOLTAREN) 75 MG EC tablet    Sig: Take 1 tablet (75 mg total) by mouth 2 (two) times daily.    Dispense:  14 tablet    Refill:  0   Ensure adequate ROM as tolerated. Will f/u with PCP if not showing improvement over the next week.  Reviewed expectations re: course of current medical issues. Questions answered. Outlined signs and symptoms indicating need for more acute intervention. Patient verbalized understanding. After Visit Summary given.  SUBJECTIVE: History from: patient. Gabriel Cook is a 47 y.o. male who reports:  Pain/Discomfort of: right hip Onset gradual, approximately 1 month ago Frequency: intermittent Injury/trama: no. Describes as: localized aching pain to his R hip without radiation Severity: moderate when present Progression: stable Relieved by: nothing in particular; "maybe not walking as much" Worsened by: movement but random onset Associated symptoms: no ROM limitations reported Extremity sensation changes or weakness: none. Self treatment: tried OTCs with relief of pain; occasional 200-400mg  ibuprofen  History of similar: no  ROS: As per HPI.   OBJECTIVE:  Vitals:   05/15/17 1734  BP: (!) 146/86  Pulse: 81  Resp: (!) 22  Temp: 99.4 F (37.4 C)  TempSrc: Oral  SpO2: 98%    General appearance: alert; no distress Extremities: no cyanosis or edema; symmetrical with no gross deformities; localized tenderness over his right trochanteric bursa with no swelling and no bruising; R hip ROM: normal "but hurts a little" CV: normal extremity capillary refill Skin: warm and dry Neurologic: normal gait; normal  symmetric reflexes in all extremities; normal sensation in all extremities Psychological: alert and cooperative; normal mood and affect  No Known Allergies  Past Medical History:  Diagnosis Date  . Diabetes mellitus without complication [facility](HCC)    Social History   Socioeconomic History  . Marital status: Married    Spouse name: Not on file  . Number of children: Not on file  . Years of education: Not on file  . Highest education level: Not on file  Social Needs  . Financial resource strain: Not on file  . Food insecurity - worry: Not on file  . Food insecurity - inability: Not on file  . Transportation needs - medical: Not on file  . Transportation needs - non-medical: Not on file  Occupational History  . Not on file  Tobacco Use  . Smoking status: Current Every Day Smoker    Packs/day: 0.25    Types: Cigarettes  . Smokeless tobacco: Never Used  Substance and Sexual Activity  . Alcohol use: Yes  . Drug use: No  . Sexual activity: Not on file  Other Topics Concern  . Not on file  Social History Narrative  . Not on file   Family History  Problem Relation Age of Onset  . Diabetes Mother   . Hypertension Mother    History reviewed. No pertinent surgical history.    Mardella LaymanHagler, Talana Slatten, MD 05/20/17 1007

## 2017-07-29 ENCOUNTER — Encounter (HOSPITAL_COMMUNITY): Payer: Self-pay | Admitting: Emergency Medicine

## 2017-07-29 ENCOUNTER — Emergency Department (HOSPITAL_COMMUNITY): Payer: 59

## 2017-07-29 ENCOUNTER — Other Ambulatory Visit: Payer: Self-pay

## 2017-07-29 ENCOUNTER — Emergency Department (HOSPITAL_COMMUNITY)
Admission: EM | Admit: 2017-07-29 | Discharge: 2017-07-29 | Disposition: A | Discharge status: Home / Self Care | Payer: 59 | Attending: Emergency Medicine | Admitting: Emergency Medicine

## 2017-07-29 DIAGNOSIS — R55 Syncope and collapse: Secondary | ICD-10-CM | POA: Diagnosis not present

## 2017-07-29 DIAGNOSIS — Y905 Blood alcohol level of 100-119 mg/100 ml: Secondary | ICD-10-CM | POA: Insufficient documentation

## 2017-07-29 DIAGNOSIS — Z7984 Long term (current) use of oral hypoglycemic drugs: Secondary | ICD-10-CM | POA: Diagnosis not present

## 2017-07-29 DIAGNOSIS — F17210 Nicotine dependence, cigarettes, uncomplicated: Secondary | ICD-10-CM | POA: Insufficient documentation

## 2017-07-29 DIAGNOSIS — F10129 Alcohol abuse with intoxication, unspecified: Secondary | ICD-10-CM | POA: Insufficient documentation

## 2017-07-29 DIAGNOSIS — Z79899 Other long term (current) drug therapy: Secondary | ICD-10-CM | POA: Diagnosis not present

## 2017-07-29 DIAGNOSIS — R0602 Shortness of breath: Secondary | ICD-10-CM | POA: Diagnosis not present

## 2017-07-29 DIAGNOSIS — R404 Transient alteration of awareness: Secondary | ICD-10-CM | POA: Diagnosis not present

## 2017-07-29 DIAGNOSIS — E860 Dehydration: Secondary | ICD-10-CM | POA: Insufficient documentation

## 2017-07-29 LAB — BASIC METABOLIC PANEL
ANION GAP: 12 (ref 5–15)
BUN: 12 mg/dL (ref 6–20)
CALCIUM: 8 mg/dL — AB (ref 8.9–10.3)
CO2: 20 mmol/L — ABNORMAL LOW (ref 22–32)
Chloride: 107 mmol/L (ref 101–111)
Creatinine, Ser: 1.08 mg/dL (ref 0.61–1.24)
GFR calc non Af Amer: >60 mL/min (ref >60)
GLUCOSE: 138 mg/dL — AB (ref 65–99)
POTASSIUM: 3.5 mmol/L (ref 3.5–5.1)
SODIUM: 139 mmol/L (ref 135–145)

## 2017-07-29 LAB — CBC WITH DIFFERENTIAL/PLATELET
BASOS PCT: 0 %
Basophils Absolute: 0 10*3/uL (ref 0.0–0.1)
Eosinophils Absolute: 0.1 10*3/uL (ref 0.0–0.7)
Eosinophils Relative: 1 %
HCT: 40.2 % (ref 39.0–52.0)
HEMOGLOBIN: 13.2 g/dL (ref 13.0–17.0)
Lymphocytes Relative: 33 %
Lymphs Abs: 2.2 10*3/uL (ref 0.7–4.0)
MCH: 29.1 pg (ref 26.0–34.0)
MCHC: 32.8 g/dL (ref 30.0–36.0)
MCV: 88.7 fL (ref 78.0–100.0)
Monocytes Absolute: 0.7 10*3/uL (ref 0.1–1.0)
Monocytes Relative: 11 %
NEUTROS ABS: 3.7 10*3/uL (ref 1.7–7.7)
Neutrophils Relative %: 55 %
Platelets: 170 10*3/uL (ref 150–400)
RBC: 4.53 MIL/uL (ref 4.22–5.81)
RDW: 13.3 % (ref 11.5–15.5)
WBC: 6.8 10*3/uL (ref 4.0–10.5)

## 2017-07-29 LAB — CBG MONITORING, ED: GLUCOSE-CAPILLARY: 146 mg/dL — AB (ref 65–99)

## 2017-07-29 LAB — ETHANOL: Alcohol, Ethyl (B): 117 mg/dL — ABNORMAL HIGH (ref <10)

## 2017-07-29 NOTE — ED Triage Notes (Signed)
Pt BIB EMS s/p near syncope. Patient has been drinking all day while watching the game. Thought his sugar was low so he ate a peppermint and called 911. On arrival, CBG was 148 and patient was lethargic and had a near syncopal episode with EMS. No radial pulses by EMS on arrival, BP 50 auscultated. Patient given approx 400 cc fluid. Patient is A&O x4 with no other complaints.

## 2017-07-29 NOTE — ED Notes (Signed)
EDP and EDPA made aware of patient's condition

## 2017-07-29 NOTE — ED Notes (Signed)
Pt tolerated ambulation in hall well. 

## 2017-07-29 NOTE — ED Notes (Signed)
Bed: [id number] Expected date:  Expected time:  Means of arrival:  Comments: 47 yo M/ Hypotension

## 2017-07-29 NOTE — ED Provider Notes (Signed)
[facility]-EMERGENCY DEPT Provider Note   CSN: [id number] Arrival date & time: 07/29/17  0102     History   Chief Complaint Chief Complaint  Patient presents with  . Hypotension    HPI Gabriel Cook is a 47 y.o. male.  The history is provided by the patient.  Loss of Consciousness   This is a new problem. Episode onset: just prior to arrival. The problem has been gradually improving. He lost consciousness for a period of less than one minute. Pertinent negatives include back pain, chest pain, fever, focal weakness, headaches and vomiting. He has tried nothing for the symptoms.   Patient with history of obesity and diabetes presents for syncope.  He reports he was drinking large amounts of alcohol today while watching basketball.  He also admits to  smoking marijuana.  He tried standing up, felt lightheaded and fell down.  He did hit his head, had a brief LOC.  He now reports feeling generalized weakness.  No chest pain.  He reports feeling mildly short of breath, but that is improving.  He has otherwise been feeling well except for mild fatigue earlier in the day Past Medical History:  Diagnosis Date  . Diabetes mellitus without complication (HCC)     There are no active problems to display for this patient.   History reviewed. No pertinent surgical history.      Home Medications    Prior to Admission medications   Medication Sig Start Date End Date Taking? Authorizing Provider  diclofenac (VOLTAREN) 75 MG EC tablet Take 1 tablet (75 mg total) by mouth 2 (two) times daily. 05/15/17   Mardella LaymanHagler, Brian, MD  fluticasone (FLONASE) 50 MCG/ACT nasal spray Place 2 sprays into both nostrils daily. 01/08/17   Cathie HoopsYu, Amy V, PA-C  insulin glargine (LANTUS) 100 UNIT/ML injection Inject 25 Units into the skin at bedtime.    [provider]  metFORMIN (GLUCOPHAGE) 1000 MG tablet Take 500 mg by mouth 2 (two) times daily with a meal.    [provider]    naproxen (NAPROSYN) 375 MG tablet Take 1 tablet (375 mg total) by mouth 2 (two) times daily. 01/08/17   Cathie HoopsYu, Amy V, PA-C  predniSONE (STERAPRED UNI-PAK 21 TAB) 10 MG (21) TBPK tablet Take as directed. 05/15/17   Mardella LaymanHagler, Brian, MD  diphenhydrAMINE (BENADRYL) 25 mg capsule Take 25 mg by mouth every 6 (six) hours as needed.  02/18/12  [provider]    Family History Family History  Problem Relation Age of Onset  . Diabetes Mother   . Hypertension Mother     Social History Social History   Tobacco Use  . Smoking status: Current Every Day Smoker    Packs/day: 0.25    Types: Cigarettes  . Smokeless tobacco: Never Used  Substance Use Topics  . Alcohol use: Yes  . Drug use: No     Allergies   Patient has no known allergies.   Review of Systems Review of Systems  Constitutional: Negative for fever.  Respiratory: Positive for shortness of breath.   Cardiovascular: Positive for syncope. Negative for chest pain.  Gastrointestinal: Negative for vomiting.  Musculoskeletal: Negative for back pain and neck pain.  Neurological: Positive for syncope. Negative for focal weakness and headaches.  All other systems reviewed and are negative.    Physical Exam Updated Vital Signs BP 102/64 (BP Location: Right Arm)   Pulse 63   Resp 17   SpO2 97%   Physical Exam CONSTITUTIONAL:  Well developed/well nourished HEAD: Normocephalic/atraumatic no signs of trauma EYES: EOMI/PERRL ENMT: Mucous membranes moist NECK: supple no meningeal signs SPINE/BACK:entire spine nontender, no bruising/crepitance/stepoffs noted to spine CV: S1/S2 noted, no murmurs/rubs/gallops noted LUNGS: Lungs are clear to auscultation bilaterally, no apparent distress ABDOMEN: soft, nontender, no rebound or guarding, bowel sounds noted throughout abdomen GU:no cva tenderness NEURO: Pt is awake/alert/appropriate, moves all extremitiesx4.  No facial droop.   EXTREMITIES: pulses normal/equal, full ROM,  scattered abrasions to right arm, no deformities SKIN: warm, color normal PSYCH: no abnormalities of mood noted, alert and oriented to situation   ED Treatments / Results  Labs (all labs ordered are listed, but only abnormal results are displayed) Labs Reviewed  BASIC METABOLIC PANEL - Abnormal; Notable for the following components:      Result Value   CO2 20 (*)    Glucose, Bld 138 (*)    Calcium 8.0 (*)    All other components within normal limits  ETHANOL - Abnormal; Notable for the following components:   Alcohol, Ethyl (B) 117 (*)    All other components within normal limits  CBG MONITORING, ED - Abnormal; Notable for the following components:   Glucose-Capillary 146 (*)    All other components within normal limits  CBC WITH DIFFERENTIAL/PLATELET    EKG EKG Interpretation  Date/Time:  Monday July 29 2017 01:51:07 EDT Ventricular Rate:  72 PR Interval:    QRS Duration: 112 QT Interval:  440 QTC Calculation: 482 R Axis:   63 Text Interpretation:  Sinus rhythm Borderline intraventricular conduction delay Nonspecific T abnormalities, lateral leads Borderline prolonged QT interval Confirmed by Zadie Rhine ([id number]) on 07/29/2017 2:29:16 AM   Radiology Dg Chest Port 1 View  Result Date: 07/29/2017 CLINICAL DATA:  47 year old male with shortness of breath and syncope. EXAM: PORTABLE CHEST 1 VIEW COMPARISON:  Chest radiograph dated 09/13/2003 FINDINGS: The heart size and mediastinal contours are within normal limits. Both lungs are clear. The visualized skeletal structures are unremarkable. IMPRESSION: No active disease. Electronically Signed   By: Elgie Collard M.D.   On: 07/29/2017 02:40    Procedures Procedures (including critical care time)  Medications Ordered in ED Medications - No data to display   Initial Impression / Assessment and Plan / ED Course  I have reviewed the triage vital signs and the nursing notes.  Pertinent labs & imaging results that were  available during my care of the patient were reviewed by me and considered in my medical decision making (see chart for details).     3:15 AM Patient initially was found to be hypotensive when EMS arrived.  He is responded to IV fluids.  He is likely dehydrated from excessive alcohol use.  No Signs of head trauma at this time, defer CT imaging  Patient with dehydration and alcohol abuse.  He is improved with IV fluids.  Blood pressures over 100.  He ambulated well.  No active chest pain at this time. Suspect all this is due to his alcohol abuse and dehydration.  I have low suspicion for acute cardiopulmonary emergency at this time. Final Clinical Impressions(s) / ED Diagnoses   Final diagnoses:  Syncope and collapse  Dehydration    ED Discharge Orders    None       Zadie Rhine, MD 07/29/17 0700

## 2018-05-12 DIAGNOSIS — E119 Type 2 diabetes mellitus without complications: Secondary | ICD-10-CM | POA: Diagnosis not present

## 2018-05-12 DIAGNOSIS — H10413 Chronic giant papillary conjunctivitis, bilateral: Secondary | ICD-10-CM | POA: Diagnosis not present

## 2018-05-12 DIAGNOSIS — H25043 Posterior subcapsular polar age-related cataract, bilateral: Secondary | ICD-10-CM | POA: Diagnosis not present

## 2018-05-22 DIAGNOSIS — E1165 Type 2 diabetes mellitus with hyperglycemia: Secondary | ICD-10-CM | POA: Diagnosis not present

## 2018-05-22 DIAGNOSIS — I10 Essential (primary) hypertension: Secondary | ICD-10-CM | POA: Diagnosis not present

## 2018-05-22 DIAGNOSIS — Z720 Tobacco use: Secondary | ICD-10-CM | POA: Diagnosis not present

## 2018-05-27 DIAGNOSIS — I10 Essential (primary) hypertension: Secondary | ICD-10-CM | POA: Diagnosis not present

## 2018-05-27 DIAGNOSIS — Z01118 Encounter for examination of ears and hearing with other abnormal findings: Secondary | ICD-10-CM | POA: Diagnosis not present

## 2018-05-27 DIAGNOSIS — E1165 Type 2 diabetes mellitus with hyperglycemia: Secondary | ICD-10-CM | POA: Diagnosis not present

## 2018-05-27 DIAGNOSIS — Z0001 Encounter for general adult medical examination with abnormal findings: Secondary | ICD-10-CM | POA: Diagnosis not present

## 2018-05-27 DIAGNOSIS — Z136 Encounter for screening for cardiovascular disorders: Secondary | ICD-10-CM | POA: Diagnosis not present

## 2018-07-10 DIAGNOSIS — E1165 Type 2 diabetes mellitus with hyperglycemia: Secondary | ICD-10-CM | POA: Diagnosis not present

## 2018-07-10 DIAGNOSIS — I119 Hypertensive heart disease without heart failure: Secondary | ICD-10-CM | POA: Diagnosis not present

## 2018-07-10 DIAGNOSIS — E782 Mixed hyperlipidemia: Secondary | ICD-10-CM | POA: Diagnosis not present

## 2019-05-26 ENCOUNTER — Encounter (HOSPITAL_COMMUNITY): Payer: Self-pay

## 2019-05-26 ENCOUNTER — Other Ambulatory Visit: Payer: Self-pay

## 2019-05-26 ENCOUNTER — Ambulatory Visit (HOSPITAL_COMMUNITY)
Admission: EM | Admit: 2019-05-26 | Discharge: 2019-05-26 | Disposition: A | Discharge status: Home / Self Care | Payer: 59 | Attending: Family Medicine | Admitting: Family Medicine

## 2019-05-26 DIAGNOSIS — Z20822 Contact with and (suspected) exposure to covid-19: Secondary | ICD-10-CM | POA: Insufficient documentation

## 2019-05-26 NOTE — Discharge Instructions (Signed)
You have been tested for COVID-19 today. °If your test returns positive, you will receive a phone call from [facility] regarding your results. °Negative test results are not called. °Both positive and negative results area always visible on MyChart. °If you do not have a MyChart account, sign up instructions are provided in your discharge papers. °Please do not hesitate to contact us should you have questions or concerns. ° °

## 2019-05-26 NOTE — ED Provider Notes (Signed)
[place]   [id number] 05/26/19 Arrival Time: 3267  ASSESSMENT & PLAN:  1. Exposure to COVID-19 virus      COVID-19 testing sent. See letter/work note on file for self-isolation guidelines. Asymptomatic at this time.  Follow-up Information    Benito Mccreedy, MD.   Specialty: Internal Medicine Why: As needed. Contact information: [place] [address] [phone]           Reviewed expectations re: course of current medical issues. Questions answered. Outlined signs and symptoms indicating need for more acute intervention. Patient verbalized understanding. After Visit Summary given.   SUBJECTIVE: History from: patient. Legend Tumminello is a 49 y.o. male who requests COVID-19 testing. Known COVID-19 contact: reports that he has been exposed recently. Recent travel: none. Denies: runny nose, congestion, fever, cough, sore throat, difficulty breathing and headache. Normal PO intake without n/v/d. "I need a work note".  ROS: As per HPI.   OBJECTIVE:  Vitals:   05/26/19 1659  BP: (!) 141/91  Pulse: 78  Resp: 16  Temp: 98.8 F (37.1 C)  TempSrc: Oral  SpO2: 100%  Weight: (!) 143.2 kg    General appearance: alert; no distress Eyes: PERRLA; EOMI; conjunctiva normal HENT: [place]; AT; nasal mucosa normal; oral mucosa normal Neck: supple  Lungs: speaks full sentences without difficulty; unlabored Extremities: no edema Skin: warm and dry Neurologic: normal gait Psychological: alert and cooperative; normal mood and affect  Labs:  Labs Reviewed  NOVEL CORONAVIRUS, NAA (HOSP ORDER, SEND-OUT TO REF LAB; TAT 18-24 HRS)     No Known Allergies  Past Medical History:  Diagnosis Date  . Diabetes mellitus without complication [facility])    Social History   Socioeconomic History  . Marital status: Married    Spouse name: Not on file  . Number of children: Not on file  . Years of education: Not on file  . Highest education level:  Not on file  Occupational History  . Not on file  Tobacco Use  . Smoking status: Current Every Day Smoker    Packs/day: 0.25    Types: Cigarettes  . Smokeless tobacco: Never Used  Substance and Sexual Activity  . Alcohol use: Yes  . Drug use: No  . Sexual activity: Not on file  Other Topics Concern  . Not on file  Social History Narrative  . Not on file   Social Determinants of Health   Financial Resource Strain:   . Difficulty of Paying Living Expenses: Not on file  Food Insecurity:   . Worried About Charity fundraiser in the Last Year: Not on file  . Ran Out of Food in the Last Year: Not on file  Transportation Needs:   . Lack of Transportation (Medical): Not on file  . Lack of Transportation (Non-Medical): Not on file  Physical Activity:   . Days of Exercise per Week: Not on file  . Minutes of Exercise per Session: Not on file  Stress:   . Feeling of Stress : Not on file  Social Connections:   . Frequency of Communication with Friends and Family: Not on file  . Frequency of Social Gatherings with Friends and Family: Not on file  . Attends Religious Services: Not on file  . Active Member of Clubs or Organizations: Not on file  . Attends Archivist Meetings: Not on file  . Marital Status: Not on file  Intimate Partner Violence:   . Fear of Current or Ex-Partner: Not on file  .  Emotionally Abused: Not on file  . Physically Abused: Not on file  . Sexually Abused: Not on file   Family History  Problem Relation Age of Onset  . Diabetes Mother   . Hypertension Mother    History reviewed. No pertinent surgical history.   Mardella Layman, MD 05/26/19 1736

## 2019-05-26 NOTE — ED Triage Notes (Signed)
Pt states he needs a Covid test. Pt was exposed to Covid.  Pt test he need a work note.

## 2019-05-28 LAB — NOVEL CORONAVIRUS, NAA (HOSP ORDER, SEND-OUT TO REF LAB; TAT 18-24 HRS): SARS-CoV-2, NAA: NOT DETECTED

## 2020-04-12 ENCOUNTER — Ambulatory Visit: Payer: Self-pay | Admitting: Medical

## 2020-04-19 ENCOUNTER — Ambulatory Visit: Payer: 59 | Admitting: Medical

## 2020-04-19 ENCOUNTER — Other Ambulatory Visit: Payer: Self-pay

## 2020-04-20 ENCOUNTER — Telehealth: Payer: Self-pay | Admitting: Medical

## 2020-04-20 NOTE — Telephone Encounter (Signed)
I called and spoke to patient to cancel tomorrow 04/21/20 appt due to me/provider not feeling well.   He was understanding.  Please find a time to get him back in soon.  See if he needs any urgent appt?   I am sending this to [place]

## 2020-04-21 ENCOUNTER — Institutional Professional Consult (permissible substitution): Payer: 59 | Admitting: Medical

## 2020-04-21 NOTE — Telephone Encounter (Signed)
Pt is coming in Monday.

## 2020-04-25 ENCOUNTER — Institutional Professional Consult (permissible substitution): Payer: 59 | Admitting: Medical

## 2020-05-06 ENCOUNTER — Institutional Professional Consult (permissible substitution): Payer: 59 | Admitting: Medical

## 2020-09-08 ENCOUNTER — Encounter: Payer: Self-pay | Admitting: Medical

## 2020-09-08 ENCOUNTER — Other Ambulatory Visit: Payer: Self-pay

## 2020-09-08 ENCOUNTER — Ambulatory Visit: Payer: 59 | Admitting: Medical

## 2020-09-08 VITALS — BP 138/88 | HR 69 | Ht 74.0 in | Wt 307.2 lb

## 2020-09-08 DIAGNOSIS — F17200 Nicotine dependence, unspecified, uncomplicated: Secondary | ICD-10-CM | POA: Insufficient documentation

## 2020-09-08 DIAGNOSIS — E1165 Type 2 diabetes mellitus with hyperglycemia: Secondary | ICD-10-CM | POA: Insufficient documentation

## 2020-09-08 DIAGNOSIS — H538 Other visual disturbances: Secondary | ICD-10-CM | POA: Insufficient documentation

## 2020-09-08 LAB — POCT URINALYSIS DIP (PROADVANTAGE DEVICE)
Bilirubin, UA: NEGATIVE
Blood, UA: NEGATIVE
Glucose, UA: NEGATIVE mg/dL
Ketones, POC UA: NEGATIVE mg/dL
Leukocytes, UA: NEGATIVE
Nitrite, UA: NEGATIVE
Protein Ur, POC: NEGATIVE mg/dL
Specific Gravity, Urine: 1.02
pH, UA: 6 (ref 5.0–8.0)

## 2020-09-08 NOTE — Progress Notes (Signed)
Subjective:  Gabriel Cook is a 50 y.o. male who presents for Chief Complaint  Patient presents with  . New Patient (Initial Visit)    Establish care and discuss diabetes meds      Here as a new patient today.  He notes a history of diabetes also discussed this.  Was seeing [facility].  Not currently on medication but was on medicaiton about a year ago for diabetes.  Lately waking up fatigued, sleepy, vision is getting bad.  Drinks a lot of water, but urine smells funny.  Has a glucometer but hasn't checked in year.  No polydipsia.  No recent weight change  The last medications was metformin and insulin, but at one point was just on metformin a year ago.    Denies hx/o HTN, hyperlipidemia, thyroid disease or heart problems.   Is a smoker, smokes 1/2 ppd x 20+ years.   Just quit alcohol a week ago, but was drinking to 40oz nightly.    Denies other health problems.  Works full time in social works.  Single.  Exercise - occasionally.   No other aggravating or relieving factors.    No other c/o.  Past Medical History:  Diagnosis Date  . Diabetes mellitus without complication (HCC) 2018   No current outpatient medications on file prior to visit.   No current facility-administered medications on file prior to visit.   The following portions of the patient's history were reviewed and updated as appropriate: allergies, current medications, past family history, past medical history, past social history, past surgical history and problem list.  ROS Otherwise as in subjective above    Objective: BP 138/88   Pulse 69   Ht 6\' 2"  (1.88 m)   Wt (!) 307 lb 3.2 oz (139.3 kg)   SpO2 96%   BMI 39.44 kg/m   General appearance: alert, no distress, well developed, well nourished Neck: supple, no lymphadenopathy, no thyromegaly, no masses Heart: RRR, normal S1, S2, no murmurs Lungs: CTA bilaterally, no wheezes, rhonchi, or rales Abdomen: +bs, soft, non tender, non distended, no masses,  no hepatomegaly, no splenomegaly Pulses: 2+ radial pulses, 2+ pedal pulses, normal cap refill Ext: no edema     Assessment: Encounter Diagnoses  Name Primary?  Uncontrolled type 2 diabetes mellitus with hyperglycemia (HCC) Yes  . Blurred vision   . Smoker      Plan: Discussed diagnosis of diabetes, criteria to make the diagnosis, possible complications of diabetes, and the opportunity to make lifestyle changes to get this under control.  Discussed short term goals of diabetes care.    Discussed follow up, lab monitoring, importance of HgbA1C.   Discussed diet in great detail, importance of exercise.  Discussed vaccinations, discussed general preventative measures including eye exams yearly with eye doctor, dental care, routine f/u here.   We will begin medication based on lab results tomorrow  Advised he stop tobacco  Return soon for fasting physical   Gabriel Cook was seen today for new patient (initial visit).  Diagnoses and all orders for this visit:  Uncontrolled type 2 diabetes mellitus with hyperglycemia (HCC) -     Comprehensive metabolic panel -     Hemoglobin A1c -     POCT Urinalysis DIP (Proadvantage Device)  Blurred vision  Smoker    Follow up: pending labs

## 2020-09-09 ENCOUNTER — Telehealth: Payer: Self-pay

## 2020-09-09 ENCOUNTER — Other Ambulatory Visit: Payer: Self-pay | Admitting: Medical

## 2020-09-09 LAB — COMPREHENSIVE METABOLIC PANEL
ALT: 28 IU/L (ref 0–44)
AST: 23 IU/L (ref 0–40)
Albumin/Globulin Ratio: 1.7 (ref 1.2–2.2)
Albumin: 4.3 g/dL (ref 4.0–5.0)
Alkaline Phosphatase: 82 IU/L (ref 44–121)
BUN/Creatinine Ratio: 13 (ref 9–20)
BUN: 10 mg/dL (ref 6–24)
Bilirubin Total: 0.4 mg/dL (ref 0.0–1.2)
CO2: 20 mmol/L (ref 20–29)
Calcium: 9.4 mg/dL (ref 8.7–10.2)
Chloride: 101 mmol/L (ref 96–106)
Creatinine, Ser: 0.79 mg/dL (ref 0.76–1.27)
Globulin, Total: 2.6 g/dL (ref 1.5–4.5)
Glucose: 122 mg/dL — ABNORMAL HIGH (ref 65–99)
Potassium: 4.5 mmol/L (ref 3.5–5.2)
Sodium: 139 mmol/L (ref 134–144)
Total Protein: 6.9 g/dL (ref 6.0–8.5)
eGFR: 108 mL/min/{1.73_m2} (ref >59)

## 2020-09-09 LAB — HEMOGLOBIN A1C
Est. average glucose Bld gHb Est-mCnc: 140 mg/dL
Hgb A1c MFr Bld: 6.5 % — ABNORMAL HIGH (ref 4.8–5.6)

## 2020-09-09 MED ORDER — ROSUVASTATIN CALCIUM 10 MG PO TABS: 10.0000 mg | ORAL_TABLET | Freq: Every day | ORAL | 3 refills | Status: DC

## 2020-09-09 MED ORDER — OZEMPIC (0.25 OR 0.5 MG/DOSE) 2 MG/1.5ML ~~LOC~~ SOPN: 0.5000 mg | PEN_INJECTOR | SUBCUTANEOUS | 2 refills | Status: DC

## 2020-09-09 NOTE — Telephone Encounter (Signed)
P.A. OZEMPIC 

## 2020-09-12 NOTE — Telephone Encounter (Signed)
P.A. approved 09/09/21, & printed discount card.  Called pt informed.  Called pharmacy went thru for $25 & they are ordering will be in tomorrow.

## 2020-09-13 NOTE — Progress Notes (Signed)
P.A. was completed for Ozempic & approved

## 2020-11-14 ENCOUNTER — Encounter: Payer: 59 | Admitting: Medical

## 2020-11-14 ENCOUNTER — Other Ambulatory Visit: Payer: Self-pay

## 2020-11-14 ENCOUNTER — Telehealth: Payer: Self-pay | Admitting: Medical

## 2020-11-14 MED ORDER — OZEMPIC (0.25 OR 0.5 MG/DOSE) 2 MG/1.5ML ~~LOC~~ SOPN: 0.5000 mg | PEN_INJECTOR | SUBCUTANEOUS | 2 refills | Status: DC

## 2020-11-14 NOTE — Telephone Encounter (Signed)
Pt called and is requesting a refill on his ozempic please send to the [id number] - [place], [place] - [address]

## 2020-12-07 ENCOUNTER — Encounter: Payer: Self-pay | Admitting: Internal Medicine

## 2020-12-22 LAB — HM DIABETES EYE EXAM

## 2020-12-26 ENCOUNTER — Encounter: Payer: Self-pay | Admitting: Internal Medicine

## 2021-02-28 ENCOUNTER — Encounter: Payer: 59 | Admitting: Medical

## 2021-02-28 DIAGNOSIS — Z0000 Encounter for general adult medical examination without abnormal findings: Secondary | ICD-10-CM

## 2021-03-01 ENCOUNTER — Encounter: Payer: Self-pay | Admitting: Medical

## 2021-04-05 ENCOUNTER — Encounter: Payer: Self-pay | Admitting: Internal Medicine

## 2021-06-07 ENCOUNTER — Ambulatory Visit (INDEPENDENT_AMBULATORY_CARE_PROVIDER_SITE_OTHER): Payer: 59 | Admitting: Medical

## 2021-06-07 ENCOUNTER — Other Ambulatory Visit: Payer: Self-pay

## 2021-06-07 ENCOUNTER — Encounter: Payer: Self-pay | Admitting: Medical

## 2021-06-07 VITALS — BP 120/82 | HR 69 | Ht 74.0 in | Wt 312.2 lb

## 2021-06-07 DIAGNOSIS — Z23 Encounter for immunization: Secondary | ICD-10-CM | POA: Diagnosis not present

## 2021-06-07 DIAGNOSIS — Z125 Encounter for screening for malignant neoplasm of prostate: Secondary | ICD-10-CM | POA: Insufficient documentation

## 2021-06-07 DIAGNOSIS — Z136 Encounter for screening for cardiovascular disorders: Secondary | ICD-10-CM

## 2021-06-07 DIAGNOSIS — Q845 Enlarged and hypertrophic nails: Secondary | ICD-10-CM

## 2021-06-07 DIAGNOSIS — Z6841 Body Mass Index (BMI) 40.0 and over, adult: Secondary | ICD-10-CM

## 2021-06-07 DIAGNOSIS — F17200 Nicotine dependence, unspecified, uncomplicated: Secondary | ICD-10-CM | POA: Diagnosis not present

## 2021-06-07 DIAGNOSIS — Z1211 Encounter for screening for malignant neoplasm of colon: Secondary | ICD-10-CM

## 2021-06-07 DIAGNOSIS — N529 Male erectile dysfunction, unspecified: Secondary | ICD-10-CM

## 2021-06-07 DIAGNOSIS — R39198 Other difficulties with micturition: Secondary | ICD-10-CM

## 2021-06-07 DIAGNOSIS — E1165 Type 2 diabetes mellitus with hyperglycemia: Secondary | ICD-10-CM | POA: Diagnosis not present

## 2021-06-07 DIAGNOSIS — Z7185 Encounter for immunization safety counseling: Secondary | ICD-10-CM | POA: Diagnosis not present

## 2021-06-07 DIAGNOSIS — R3911 Hesitancy of micturition: Secondary | ICD-10-CM | POA: Insufficient documentation

## 2021-06-07 DIAGNOSIS — B356 Tinea cruris: Secondary | ICD-10-CM

## 2021-06-07 DIAGNOSIS — R5383 Other fatigue: Secondary | ICD-10-CM

## 2021-06-07 DIAGNOSIS — Z122 Encounter for screening for malignant neoplasm of respiratory organs: Secondary | ICD-10-CM | POA: Diagnosis not present

## 2021-06-07 DIAGNOSIS — Z0000 Encounter for general adult medical examination without abnormal findings: Secondary | ICD-10-CM | POA: Diagnosis not present

## 2021-06-07 DIAGNOSIS — Z113 Encounter for screening for infections with a predominantly sexual mode of transmission: Secondary | ICD-10-CM

## 2021-06-07 LAB — POCT GLYCOSYLATED HEMOGLOBIN (HGB A1C): Hemoglobin A1C: 6.7 % — AB (ref 4.0–5.6)

## 2021-06-07 MED ORDER — TADALAFIL 5 MG PO TABS: 5.0000 mg | ORAL_TABLET | Freq: Every day | ORAL | 11 refills | Status: DC

## 2021-06-07 MED ORDER — OZEMPIC (1 MG/DOSE) 4 MG/3ML ~~LOC~~ SOPN: 1.0000 mg | PEN_INJECTOR | SUBCUTANEOUS | 5 refills | Status: DC

## 2021-06-07 MED ORDER — TERBINAFINE HCL 1 % EX CREA: 1.0000 "application " | TOPICAL_CREAM | Freq: Two times a day (BID) | CUTANEOUS | 1 refills | Status: DC

## 2021-06-07 NOTE — Patient Instructions (Signed)
This visit was a preventative care visit, also known as wellness visit or routine physical.   Topics typically include healthy lifestyle, diet, exercise, preventative care, vaccinations, sick and well care, proper use of emergency dept and after hours care, as well as other concerns.     Recommendations: Continue to return yearly for your annual wellness and preventative care visits.  This gives [place] a chance to discuss healthy lifestyle, exercise, vaccinations, review your chart record, and perform screenings where appropriate.  I recommend you see your eye doctor yearly for routine vision care.  I recommend you see your dentist yearly for routine dental care including hygiene visits twice yearly.   Vaccination recommendations were reviewed  There is no immunization history on file for this patient.  Counseled on the Tdap (tetanus, diptheria, and acellular pertussis) vaccine.  Vaccine information sheet given. Tdap vaccine given after consent obtained.  Counseled on the pneumococcal vaccine.  Vaccine information sheet given.  Pneumococcal vaccine Prevnar 20 given after consent obtained.    Screening for cancer: Colon cancer screening: We will refer you for Cologard   We discussed PSA, prostate exam, and prostate cancer screening risks/benefits.   Baseline PSA screening today  Skin cancer screening: Check your skin regularly for new changes, growing lesions, or other lesions of concern Come in for evaluation if you have skin lesions of concern.  Lung cancer screening: If you have a greater than 20 pack year history of tobacco use, then you may qualify for lung cancer screening with a chest CT scan.   Please call your insurance company to inquire about coverage for this test.  We currently don't have screenings for other cancers besides breast, cervical, colon, and lung cancers.  If you have a strong family history of cancer or have other cancer screening concerns, please let me know.    We are ordering a CT coronary score on you today.  Expect a phone call about scheduling this test.  Although this test is meant to be heart disease screen it will give [place] an idea of how your lungs looks as well    Bone health: Get at least 150 minutes of aerobic exercise weekly Get weight bearing exercise at least once weekly Bone density test:  A bone density test is an imaging test that uses a type of X-ray to measure the amount of calcium and other minerals in your bones. The test may be used to diagnose or screen you for a condition that causes weak or thin bones (osteoporosis), predict your risk for a broken bone (fracture), or determine how well your osteoporosis treatment is working. The bone density test is recommended for females 65 and older, or females or males <65 if certain risk factors such as thyroid disease, long term use of steroids such as for asthma or rheumatological issues, vitamin D deficiency, estrogen deficiency, family history of osteoporosis, self or family history of fragility fracture in first degree relative.    Heart health: Get at least 150 minutes of aerobic exercise weekly Limit alcohol It is important to maintain a healthy blood pressure and healthy cholesterol numbers  Heart disease screening: Screening for heart disease includes screening for blood pressure, fasting lipids, glucose/diabetes screening, BMI height to weight ratio, reviewed of smoking status, physical activity, and diet.    Goals include blood pressure 120/80 or less, maintaining a healthy lipid/cholesterol profile, preventing diabetes or keeping diabetes numbers under good control, not smoking or using tobacco products, exercising most days per week or  at least 150 minutes per week of exercise, and eating healthy variety of fruits and vegetables, healthy oils, and avoiding unhealthy food choices like fried food, fast food, high sugar and high cholesterol foods.    Other tests may possibly  include EKG test, CT coronary calcium score, echocardiogram, exercise treadmill stress test.   Referral today for CT coronary calcium score test   Medical care options: I recommend you continue to seek care here first for routine care.  We try really hard to have available appointments Monday through Friday daytime hours for sick visits, acute visits, and physicals.  Urgent care should be used for after hours and weekends for significant issues that cannot wait till the next day.  The emergency department should be used for significant potentially life-threatening emergencies.  The emergency department is expensive, can often have long wait times for less significant concerns, so try to utilize primary care, urgent care, or telemedicine when possible to avoid unnecessary trips to the emergency department.  Virtual visits and telemedicine have been introduced since the pandemic started in 2020, and can be convenient ways to receive medical care.  We offer virtual appointments as well to assist you in a variety of options to seek medical care.   Advanced Directives: I recommend you consider completing a Health Care Power of Attorney and Living Will.   These documents respect your wishes and help alleviate burdens on your loved ones if you were to become terminally ill or be in a position to need those documents enforced.    You can complete Advanced Directives yourself, have them notarized, then have copies made for our office, for you and for anybody you feel should have them in safe keeping.  Or, you can have an attorney prepare these documents.   If you haven't updated your Last Will and Testament in a while, it may be worthwhile having an attorney prepare these documents together and save on some costs.       Separate significant issues discussed: Diabetes Check your blood sugars fasting several days per week.  Goal is less than 130 fasting Continue Ozempic but lets increase the dose to help  with weight loss as well Exercise at least 150 minutes/week Work on eating a healthy low sugar low-fat diet Have your eye doctor do a diabetic eye exam and make sure they send [place] a copy Check your feet daily for sores or wounds Let me know if you want a referral to podiatry for nail care A standard of care recommendation is to take a cholesterol pill daily to lower your risk of heart disease.  I recommend you restart cholesterol medication daily   Hypertrophic nails and diabetes Consider podiatry referral   Tobacco use I strongly recommend you quit smoking.  There are medications and counseling to help quit   Fatigue, decreased energy We will check some baseline labs today If there is any concern for sleep apnea such as snoring or nonrestful sleep then we may want to consider a sleep study to evaluate for sleep apnea   Erectile dysfunction Begin trial of Cialis 5 mg daily use for help with erections and prostate symptoms   Urinary stream change Your symptoms suggest prostate enlargement I am going to have you start Cialis 5 mg daily for erectile dysfunction and prostate symptoms if insurance will cover this    Jock itch/tinea cruris Begin Lamisil cream daily for the next few weeks to clear up the rash in your inner upper thighs  Obesity  Work on losing weight through healthy diet and exercise I am going to increase your Ozempic dose to help with this as well Exercise at least 150 minutes/week Cut out 500 cal/day where possible     DENTIST RECOMMENDATION Dr. Yancey Flemings, dentist [address], [place], [place] [address] [phone] [email or web address]    Ophthalmologists [facility] [address] [place], [place], [place] [address] [phone]  [facility] [address], [place], [place] [address] [phone]  [facility] [address] [place], [place] [address] [phone]  [facility]  [address] [place],  [place], [place] [address] [phone]  [facility] [address], [place], [place] [address] [phone]

## 2021-06-07 NOTE — Progress Notes (Signed)
Subjective:   HPI  Gabriel Cook is a 51 y.o. male who presents for Chief Complaint  Patient presents with   fasting cpe     Fasting cpe, declines flu shot    Patient Care Team: Gwendolyn Mclees, Cleda Mccreedy as PCP - General (Family Medicine) Sees dentist Sees eye doctor, Dr Dione Booze   Concerns: Diabetes-no recent concerns.  Compliant with Ozempic 0.5 mg weekly not checking sugars.  He does have a glucometer.  He has several other concerns.  He notes low energy in general.  He notes several month history of erectile dysfunction.  He is able to get some erections but they are not like they used to be.  No prior treatment for this.   No chest pain or shortness of breath, no edema  He notes some issues with urination.  He has to strain to get urine to come out in the mornings.  He does get up at least once a night to urinate.  He is not compliant with statin  He is a long-term smoker probably 30-year history.  No shortness of breath.  He gets some right knee pain.  No recent injury or trauma   Reviewed their medical, surgical, family, social, medication, and allergy history and updated chart as appropriate.  Past Medical History:  Diagnosis Date   Cataract    Diabetes mellitus without complication (HCC) 2018    Past Surgical History:  Procedure Laterality Date   CATARACT EXTRACTION      Family History  Problem Relation Age of Onset   Cancer Mother        ?   Diabetes Mother    Hypertension Mother    Diabetes Father    Heart disease Neg Hx    Stroke Neg Hx      Current Outpatient Medications:    Semaglutide, 1 MG/DOSE, (OZEMPIC, 1 MG/DOSE,) 4 MG/3ML SOPN, Inject 1 mg into the skin once a week., Disp: 3 mL, Rfl: 5   Semaglutide,0.25 or 0.5MG /DOS, (OZEMPIC, 0.25 OR 0.5 MG/DOSE,) 2 MG/1.5ML SOPN, Inject 0.5 mg into the skin once a week., Disp: 1.5 mL, Rfl: 2   tadalafil (CIALIS) 5 MG tablet, Take 1 tablet (5 mg total) by mouth daily., Disp: 30 tablet, Rfl: 11    terbinafine (LAMISIL AT) 1 % cream, Apply 1 application topically 2 (two) times daily., Disp: 30 g, Rfl: 1   rosuvastatin (CRESTOR) 10 MG tablet, Take 1 tablet (10 mg total) by mouth daily. (Patient not taking: Reported on 06/07/2021), Disp: 90 tablet, Rfl: 3  No Known Allergies   Review of Systems Constitutional: -fever, -chills, -sweats, -unexpected weight change, -decreased appetite, +fatigue Allergy: -sneezing, -itching, -congestion Dermatology: -changing moles, --rash, -lumps ENT: -runny nose, -ear pain, -sore throat, -hoarseness, -sinus pain, -teeth pain, - ringing in ears, -hearing loss, -nosebleeds Cardiology: -chest pain, -palpitations, -swelling, -difficulty breathing when lying flat, -waking up short of breath Respiratory: -cough, -shortness of breath, -difficulty breathing with exercise or exertion, -wheezing, -coughing up blood Gastroenterology: -abdominal pain, -nausea, -vomiting, -diarrhea, -constipation, -blood in stool, -changes in bowel movement, -difficulty swallowing or eating Hematology: -bleeding, -bruising  Musculoskeletal: +joint aches, -muscle aches, -joint swelling, -back pain, -neck pain, -cramping, -changes in gait Ophthalmology: denies vision changes, eye redness, itching, discharge Urology: -burning with urination, +difficulty urinating, -blood in urine, -urinary frequency, -urgency, -incontinence Neurology: -headache, -weakness, -tingling, -numbness, -memory loss, -falls, -dizziness Psychology: -depressed mood, -agitation, -sleep problems Male GU: no testicular mass, pain, no lymph nodes swollen, no swelling, no rash.  Depression screen [facility] 2/9 06/07/2021 09/08/2020  Decreased Interest 0 0  Down, Depressed, Hopeless 0 0  PHQ - 2 Score 0 0        Objective:  BP 120/82    Pulse 69    Ht 6\' 2"  (1.88 m)    Wt (!) 312 lb 3.2 oz (141.6 kg)    BMI 40.08 kg/m   Wt Readings from Last 3 Encounters:  06/07/21 (!) 312 lb 3.2 oz (141.6 kg)  09/08/20 (!) 307 lb 3.2 oz  (139.3 kg)  05/26/19 (!) 315 lb 9.6 oz (143.2 kg)    General appearance: alert, no distress, WD/WN, African American male Skin: Contiguous rash in the upper inner thighs suggestive of tinea cruris, otherwise unremarkable HEENT: normocephalic, conjunctiva/corneas normal, sclerae anicteric, PERRLA, EOMi, nares patent, no discharge or erythema, pharynx normal Oral cavity: MMM, tongue normal, teeth with moderate plaque neck: supple, no lymphadenopathy, no thyromegaly, no masses, normal ROM, no bruits Chest: non tender, normal shape and expansion Heart: RRR, normal S1, S2, no murmurs Lungs: CTA bilaterally, no wheezes, rhonchi, or rales Abdomen: +bs, soft, non tender, non distended, no masses, no hepatomegaly, no splenomegaly, no bruits Back: non tender, normal ROM, no scoliosis Musculoskeletal: upper extremities non tender, no obvious deformity, normal ROM throughout, lower extremities non tender, no obvious deformity, normal ROM throughout Extremities: no edema, no cyanosis, no clubbing Pulses: 2+ symmetric, upper and lower extremities, normal cap refill Neurological: alert, oriented x 3, CN2-12 intact, strength normal upper extremities and lower extremities, sensation normal throughout, DTRs 2+ throughout, no cerebellar signs, gait normal Psychiatric: normal affect, behavior normal, pleasant  GU: normal male external genitalia,circumcised, nontender, no masses, no hernia, no lymphadenopathy Rectal: Declines rectal   Assessment and Plan :   Encounter Diagnoses  Name Primary?   Encounter for health maintenance examination in adult Yes   Uncontrolled type 2 diabetes mellitus with hyperglycemia (HCC)    Smoker    Screening for prostate cancer    Screen for colon cancer    Screening for lung cancer    Screening for heart disease    Screen for STD (sexually transmitted disease)    Need for Tdap vaccination    Need for pneumococcal vaccination    Vaccine counseling    Other fatigue     Erectile dysfunction, unspecified erectile dysfunction type    Decreased urine stream    Urinary hesitancy    BMI 40.0-44.9, adult (HCC)    Enlarged and hypertrophic nails    Tinea cruris     This visit was a preventative care visit, also known as wellness visit or routine physical.   Topics typically include healthy lifestyle, diet, exercise, preventative care, vaccinations, sick and well care, proper use of emergency dept and after hours care, as well as other concerns.     Recommendations: Continue to return yearly for your annual wellness and preventative care visits.  This gives 02-10-1982 a chance to discuss healthy lifestyle, exercise, vaccinations, review your chart record, and perform screenings where appropriate.  I recommend you see your eye doctor yearly for routine vision care.  I recommend you see your dentist yearly for routine dental care including hygiene visits twice yearly.   Vaccination recommendations were reviewed Immunization History  Administered Date(s) Administered   PNEUMOCOCCAL CONJUGATE-20 06/07/2021   Tdap 06/07/2021    Counseled on the Tdap (tetanus, diptheria, and acellular pertussis) vaccine.  Vaccine information sheet given. Tdap vaccine given after consent obtained.  Counseled on the pneumococcal vaccine.  Vaccine information sheet given.  Pneumococcal vaccine Prevnar 20 given after consent obtained.    Screening for cancer: Colon cancer screening: We will refer you for Cologard   We discussed PSA, prostate exam, and prostate cancer screening risks/benefits.   Baseline PSA screening today  Skin cancer screening: Check your skin regularly for new changes, growing lesions, or other lesions of concern Come in for evaluation if you have skin lesions of concern.  Lung cancer screening: If you have a greater than 20 pack year history of tobacco use, then you may qualify for lung cancer screening with a chest CT scan.   Please call your insurance company  to inquire about coverage for this test.  We currently don't have screenings for other cancers besides breast, cervical, colon, and lung cancers.  If you have a strong family history of cancer or have other cancer screening concerns, please let me know.   We are ordering a CT coronary score on you today.  Expect a phone call about scheduling this test.  Although this test is meant to be heart disease screen it will give us an idea of how your lungs looks as well    Bone health: Get at least 150 minutes of aerobic exercise weekly Get weight bearing exercise at least once weekly Bone density test:  A bone density test is an imaging test that uses a type of X-ray to measure the amount of calcium and other minerals in your bones. The test may be used to diagnose or screen you for a condition that causes weak or thin bones (osteoporosis), predict your risk for a broken bone (fracture), or determine how well your osteoporosis treatment is working. The bone density test is recommended for females 65 and older, or females or males <65 if certain risk factors such as thyroid disease, long term use of steroids such as for asthma or rheumatological issues, vitamin D deficiency, estrogen deficiency, family history of osteoporosis, self or family history of fragility fracture in first degree relative.    Heart health: Get at least 150 minutes of aerobic exercise weekly Limit alcohol It is important to maintain a healthy blood pressure and healthy cholesterol numbers  Heart disease screening: Screening for heart disease includes screening for blood pressure, fasting lipids, glucose/diabetes screening, BMI height to weight ratio, reviewed of smoking status, physical activity, and diet.    Goals include blood pressure 120/80 or less, maintaining a healthy lipid/cholesterol profile, preventing diabetes or keeping diabetes numbers under good control, not smoking or using tobacco products, exercising most  days per week or at least 150 minutes per week of exercise, and eating healthy variety of fruits and vegetables, healthy oils, and avoiding unhealthy food choices like fried food, fast food, high sugar and high cholesterol foods.    Other tests may possibly include EKG test, CT coronary calcium score, echocardiogram, exercise treadmill stress test.   Referral today for CT coronary calcium score test   Medical care options: I recommend you continue to seek care here first for routine care.  We try really hard to have available appointments Monday through Friday daytime hours for sick visits, acute visits, and physicals.  Urgent care should be used for after hours and weekends for significant issues that cannot wait till the next day.  The emergency department should be used for significant potentially life-threatening emergencies.  The emergency department is expensive, can often have long wait times for less significant concerns, so try to utilize primary care, urgent care,  or telemedicine when possible to avoid unnecessary trips to the emergency department.  Virtual visits and telemedicine have been introduced since the pandemic started in 2020, and can be convenient ways to receive medical care.  We offer virtual appointments as well to assist you in a variety of options to seek medical care.   Advanced Directives: I recommend you consider completing a Health Care Power of Attorney and Living Will.   These documents respect your wishes and help alleviate burdens on your loved ones if you were to become terminally ill or be in a position to need those documents enforced.    You can complete Advanced Directives yourself, have them notarized, then have copies made for our office, for you and for anybody you feel should have them in safe keeping.  Or, you can have an attorney prepare these documents.   If you haven't updated your Last Will and Testament in a while, it may be worthwhile having an  attorney prepare these documents together and save on some costs.       Separate significant issues discussed: Diabetes Check your blood sugars fasting several days per week.  Goal is less than 130 fasting Continue Ozempic but lets increase the dose to help with weight loss as well Exercise at least 150 minutes/week Work on eating a healthy low sugar low-fat diet Have your eye doctor do a diabetic eye exam and make sure they send us a copy Check your feet daily for sores or wounds Let me know if you want a referral to podiatry for nail care A standard of care recommendation is to take a cholesterol pill daily to lower your risk of heart disease.  I recommend you restart cholesterol medication daily   Hypertrophic nails and diabetes Consider podiatry referral   Tobacco use I strongly recommend you quit smoking.  There are medications and counseling to help quit   Fatigue, decreased energy We will check some baseline labs today If there is any concern for sleep apnea such as snoring or nonrestful sleep then we may want to consider a sleep study to evaluate for sleep apnea   Erectile dysfunction Begin trial of Cialis 5 mg daily use for help with erections and prostate symptoms   Urinary stream change Your symptoms suggest prostate enlargement I am going to have you start Cialis 5 mg daily for erectile dysfunction and prostate symptoms if insurance will cover this    Jock itch/tinea cruris Begin Lamisil cream daily for the next few weeks to clear up the rash in your inner upper thighs   Obesity  Work on losing weight through healthy diet and exercise I am going to increase your Ozempic dose to help with this as well Exercise at least 150 minutes/week Cut out 500 cal/day where possible    Les PouCarlton was seen today for fasting cpe .  Diagnoses and all orders for this visit:  Encounter for health maintenance examination in adult -     Comprehensive metabolic panel -      CBC -     PSA -     Lipid panel -     Microalbumin/Creatinine Ratio, Urine -     Urinalysis, Routine w reflex microscopic -     RPR+HIV+GC+CT Panel -     Hepatitis C antibody -     Hepatitis B surface antigen -     Cologuard -     CT CARDIAC SCORING ([facility] LOCATIONS ONLY); Future -     TSH  Uncontrolled type 2 diabetes mellitus with hyperglycemia (HCC) -     HgB A1c -     Comprehensive metabolic panel -     Microalbumin/Creatinine Ratio, Urine  Smoker  Screening for prostate cancer -     PSA  Screen for colon cancer  Screening for lung cancer -     Cologuard  Screening for heart disease -     CT CARDIAC SCORING ([facility] LOCATIONS ONLY); Future  Screen for STD (sexually transmitted disease) -     RPR+HIV+GC+CT Panel -     Hepatitis C antibody -     Hepatitis B surface antigen  Need for Tdap vaccination -     Tdap vaccine greater than or equal to 7yo IM  Need for pneumococcal vaccination -     Pneumococcal conjugate vaccine 20-valent (Prevnar 20)  Vaccine counseling  Other fatigue -     TSH  Erectile dysfunction, unspecified erectile dysfunction type  Decreased urine stream  Urinary hesitancy  BMI 40.0-44.9, adult (HCC)  Enlarged and hypertrophic nails  Tinea cruris  Other orders -     Semaglutide, 1 MG/DOSE, (OZEMPIC, 1 MG/DOSE,) 4 MG/3ML SOPN; Inject 1 mg into the skin once a week. -     terbinafine (LAMISIL AT) 1 % cream; Apply 1 application topically 2 (two) times daily. -     tadalafil (CIALIS) 5 MG tablet; Take 1 tablet (5 mg total) by mouth daily.    Follow-up pending labs, yearly for physical

## 2021-06-08 ENCOUNTER — Other Ambulatory Visit: Payer: Self-pay | Admitting: Medical

## 2021-06-08 LAB — URINALYSIS, ROUTINE W REFLEX MICROSCOPIC
Bilirubin, UA: NEGATIVE
Glucose, UA: NEGATIVE
Ketones, UA: NEGATIVE
Leukocytes,UA: NEGATIVE
Nitrite, UA: NEGATIVE
Protein,UA: NEGATIVE
RBC, UA: NEGATIVE
Specific Gravity, UA: 1.02 (ref 1.005–1.030)
Urobilinogen, Ur: 0.2 mg/dL (ref 0.2–1.0)
pH, UA: 5 (ref 5.0–7.5)

## 2021-06-08 LAB — COMPREHENSIVE METABOLIC PANEL
ALT: 34 IU/L (ref 0–44)
AST: 20 IU/L (ref 0–40)
Albumin/Globulin Ratio: 1.6 (ref 1.2–2.2)
Albumin: 4.7 g/dL (ref 4.0–5.0)
Alkaline Phosphatase: 85 IU/L (ref 44–121)
BUN/Creatinine Ratio: 14 (ref 9–20)
BUN: 12 mg/dL (ref 6–24)
Bilirubin Total: 0.6 mg/dL (ref 0.0–1.2)
CO2: 23 mmol/L (ref 20–29)
Calcium: 10.1 mg/dL (ref 8.7–10.2)
Chloride: 99 mmol/L (ref 96–106)
Creatinine, Ser: 0.86 mg/dL (ref 0.76–1.27)
Globulin, Total: 3 g/dL (ref 1.5–4.5)
Glucose: 131 mg/dL — ABNORMAL HIGH (ref 70–99)
Potassium: 4.8 mmol/L (ref 3.5–5.2)
Sodium: 136 mmol/L (ref 134–144)
Total Protein: 7.7 g/dL (ref 6.0–8.5)
eGFR: 105 mL/min/{1.73_m2} (ref >59)

## 2021-06-08 LAB — CBC
Hematocrit: 46.7 % (ref 37.5–51.0)
Hemoglobin: 15.5 g/dL (ref 13.0–17.7)
MCH: 28.5 pg (ref 26.6–33.0)
MCHC: 33.2 g/dL (ref 31.5–35.7)
MCV: 86 fL (ref 79–97)
Platelets: 206 10*3/uL (ref 150–450)
RBC: 5.43 x10E6/uL (ref 4.14–5.80)
RDW: 11.8 % (ref 11.6–15.4)
WBC: 7.3 10*3/uL (ref 3.4–10.8)

## 2021-06-08 LAB — LIPID PANEL
Chol/HDL Ratio: 3.1 ratio (ref 0.0–5.0)
Cholesterol, Total: 232 mg/dL — ABNORMAL HIGH (ref 100–199)
HDL: 76 mg/dL (ref >39)
LDL Chol Calc (NIH): 140 mg/dL — ABNORMAL HIGH (ref 0–99)
Triglycerides: 92 mg/dL (ref 0–149)
VLDL Cholesterol Cal: 16 mg/dL (ref 5–40)

## 2021-06-08 LAB — MICROALBUMIN / CREATININE URINE RATIO
Creatinine, Urine: 103.4 mg/dL
Microalb/Creat Ratio: <3 mg/g creat (ref 0–29)
Microalbumin, Urine: <3 ug/mL

## 2021-06-08 LAB — PSA: Prostate Specific Ag, Serum: 0.6 ng/mL (ref 0.0–4.0)

## 2021-06-08 LAB — RPR+HIV+GC+CT PANEL
Chlamydia trachomatis, NAA: NEGATIVE
HIV Screen 4th Generation wRfx: NONREACTIVE
Neisseria Gonorrhoeae by PCR: NEGATIVE
RPR Ser Ql: NONREACTIVE

## 2021-06-08 LAB — TSH: TSH: 1.91 u[IU]/mL (ref 0.450–4.500)

## 2021-06-08 LAB — HEPATITIS B SURFACE ANTIGEN: Hepatitis B Surface Ag: NEGATIVE

## 2021-06-08 LAB — HEPATITIS C ANTIBODY: Hep C Virus Ab: <0.1 s/co ratio (ref 0.0–0.9)

## 2021-06-08 MED ORDER — ROSUVASTATIN CALCIUM 10 MG PO TABS: 10.0000 mg | ORAL_TABLET | Freq: Every day | ORAL | 3 refills | Status: DC

## 2021-06-15 ENCOUNTER — Telehealth: Payer: Self-pay

## 2021-06-15 NOTE — Telephone Encounter (Signed)
Pt would like a letter stating that he can wear tennis shoes to work in due to his diabetes.  Pt states they are much comfortable to work in.

## 2021-06-20 NOTE — Telephone Encounter (Signed)
Pt states he is required to wear dress shoes at all times and his feet hurt constantly.  Said tennis shoes would be much more comfortable.  Said his feet tingle and hurt.  York Spaniel he is going to get his nails clipped like you suggested.   He said that his job would allow him to wear tennis shoes if you would write him a note.

## 2021-06-22 NOTE — Telephone Encounter (Signed)
Letter e-mailed to patient.

## 2021-06-24 ENCOUNTER — Emergency Department (HOSPITAL_COMMUNITY)
Admission: EM | Admit: 2021-06-24 | Discharge: 2021-06-24 | Disposition: A | Discharge status: Home / Self Care | Payer: 59 | Attending: Emergency Medicine | Admitting: Emergency Medicine

## 2021-06-24 ENCOUNTER — Encounter (HOSPITAL_COMMUNITY): Payer: Self-pay | Admitting: *Deleted

## 2021-06-24 ENCOUNTER — Emergency Department (HOSPITAL_COMMUNITY): Payer: 59

## 2021-06-24 ENCOUNTER — Other Ambulatory Visit: Payer: Self-pay

## 2021-06-24 DIAGNOSIS — Z7901 Long term (current) use of anticoagulants: Secondary | ICD-10-CM | POA: Insufficient documentation

## 2021-06-24 DIAGNOSIS — F17210 Nicotine dependence, cigarettes, uncomplicated: Secondary | ICD-10-CM | POA: Insufficient documentation

## 2021-06-24 DIAGNOSIS — Z794 Long term (current) use of insulin: Secondary | ICD-10-CM | POA: Diagnosis not present

## 2021-06-24 DIAGNOSIS — E119 Type 2 diabetes mellitus without complications: Secondary | ICD-10-CM | POA: Insufficient documentation

## 2021-06-24 DIAGNOSIS — I4891 Unspecified atrial fibrillation: Secondary | ICD-10-CM

## 2021-06-24 DIAGNOSIS — R079 Chest pain, unspecified: Secondary | ICD-10-CM | POA: Diagnosis present

## 2021-06-24 LAB — BASIC METABOLIC PANEL
Anion gap: 10 (ref 5–15)
BUN: 10 mg/dL (ref 6–20)
CO2: 23 mmol/L (ref 22–32)
Calcium: 8.9 mg/dL (ref 8.9–10.3)
Chloride: 101 mmol/L (ref 98–111)
Creatinine, Ser: 0.88 mg/dL (ref 0.61–1.24)
GFR, Estimated: >60 mL/min (ref >60)
Glucose, Bld: 204 mg/dL — ABNORMAL HIGH (ref 70–99)
Potassium: 4 mmol/L (ref 3.5–5.1)
Sodium: 134 mmol/L — ABNORMAL LOW (ref 135–145)

## 2021-06-24 LAB — CBC
HCT: 42.9 % (ref 39.0–52.0)
Hemoglobin: 14.3 g/dL (ref 13.0–17.0)
MCH: 29.9 pg (ref 26.0–34.0)
MCHC: 33.3 g/dL (ref 30.0–36.0)
MCV: 89.6 fL (ref 80.0–100.0)
Platelets: 210 10*3/uL (ref 150–400)
RBC: 4.79 MIL/uL (ref 4.22–5.81)
RDW: 12.8 % (ref 11.5–15.5)
WBC: 7.5 10*3/uL (ref 4.0–10.5)
nRBC: 0 % (ref 0.0–0.2)

## 2021-06-24 LAB — TROPONIN I (HIGH SENSITIVITY)
Troponin I (High Sensitivity): 6 ng/L (ref <18)
Troponin I (High Sensitivity): 7 ng/L (ref <18)

## 2021-06-24 LAB — MAGNESIUM: Magnesium: 1.9 mg/dL (ref 1.7–2.4)

## 2021-06-24 MED ORDER — DILTIAZEM HCL 25 MG/5ML IV SOLN
25.0000 mg | Freq: Once | INTRAVENOUS | Status: DC
Start: 2021-06-24 — End: 2021-06-24

## 2021-06-24 MED ORDER — LACTATED RINGERS IV BOLUS
1000.0000 mL | Freq: Once | INTRAVENOUS | Status: AC
  Administered 2021-06-24: 1000 mL via INTRAVENOUS

## 2021-06-24 MED ORDER — DILTIAZEM HCL 25 MG/5ML IV SOLN: 15.0000 mg | Freq: Once | INTRAVENOUS | Status: DC

## 2021-06-24 MED ORDER — RIVAROXABAN 20 MG PO TABS: 20.0000 mg | ORAL_TABLET | Freq: Every day | ORAL | 0 refills | Status: DC

## 2021-06-24 MED ORDER — PROPOFOL 10 MG/ML IV BOLUS
INTRAVENOUS | Status: AC
  Filled 2021-06-24: qty 40

## 2021-06-24 MED ORDER — DILTIAZEM HCL 25 MG/5ML IV SOLN
20.0000 mg | Freq: Once | INTRAVENOUS | Status: AC
  Administered 2021-06-24: 20 mg via INTRAVENOUS
  Filled 2021-06-24: qty 5

## 2021-06-24 MED ORDER — RIVAROXABAN 10 MG PO TABS: 20.0000 mg | ORAL_TABLET | Freq: Once | ORAL | Status: DC

## 2021-06-24 MED ORDER — PROPOFOL 10 MG/ML IV BOLUS
INTRAVENOUS | Status: AC | PRN
  Administered 2021-06-24 (×3): 20 mg via INTRAVENOUS
  Administered 2021-06-24: 50 mg via INTRAVENOUS

## 2021-06-24 MED ORDER — RIVAROXABAN 10 MG PO TABS
20.0000 mg | ORAL_TABLET | Freq: Once | ORAL | Status: AC
  Administered 2021-06-24: 20 mg via ORAL
  Filled 2021-06-24: qty 2

## 2021-06-24 MED ORDER — PROPOFOL 10 MG/ML IV BOLUS: 2.0000 mg/kg | Freq: Once | INTRAVENOUS | Status: DC

## 2021-06-24 MED ORDER — RIVAROXABAN 15 MG PO TABS: 15.0000 mg | ORAL_TABLET | Freq: Once | ORAL | Status: DC

## 2021-06-24 NOTE — ED Triage Notes (Signed)
Pt was woken up this morning with centralized, nonradiating chest pain. Hx of DM. Reports feeling like his heart is racing

## 2021-06-24 NOTE — Discharge Instructions (Addendum)

## 2021-06-24 NOTE — ED Notes (Signed)
Called pharmacy to notify of xarelto order and to clarify if that is the order requiring education d/t new onset A-fib

## 2021-06-24 NOTE — ED Notes (Signed)
Reviewed discharge instructions with patient. Follow-up care and medications reviewed. Patient  verbalized understanding. Patient A&Ox4, VSS, and ambulatory with steady gait upon discharge.  °

## 2021-06-25 NOTE — ED Provider Notes (Signed)
Emergency Department Provider Note  I have reviewed the triage vital signs and the nursing notes.  HISTORY  Chief Complaint Chest Pain   HPI Gabriel Cook is a 51 y.o. male with without significant past medical history who presents emerged part today with chest pain and palpitations  PMH Past Medical History:  Diagnosis Date   Cataract    Diabetes mellitus without complication ([place]) 99991111    Home Medications Prior to Admission medications   Medication Sig Start Date End Date Taking? Authorizing Provider  rivaroxaban (XARELTO) 20 MG TABS tablet Take 1 tablet (20 mg total) by mouth daily with supper. 06/24/21  Yes Randall Rampersad, Corene Cornea, MD  rosuvastatin (CRESTOR) 10 MG tablet Take 1 tablet (10 mg total) by mouth daily. 06/08/21 06/08/22  Tysinger, Camelia Eng, PA-C  Semaglutide, 1 MG/DOSE, (OZEMPIC, 1 MG/DOSE,) 4 MG/3ML SOPN Inject 1 mg into the skin once a week. 06/07/21   Tysinger, Camelia Eng, PA-C  tadalafil (CIALIS) 5 MG tablet Take 1 tablet (5 mg total) by mouth daily. 06/07/21   Tysinger, Camelia Eng, PA-C  terbinafine (LAMISIL AT) 1 % cream Apply 1 application topically 2 (two) times daily. 06/07/21   Tysinger, Camelia Eng, PA-C    Social History Social History   Tobacco Use   Smoking status: Every Day    Packs/day: 0.25    Years: 30.00    Pack years: 7.50    Types: Cigarettes   Smokeless tobacco: Never  Substance Use Topics   Alcohol use: Yes    Alcohol/week: 4.0 standard drinks    Types: 4 Cans of beer per week   Drug use: No    Review of Systems: Documented in HPI ____________________________________________  PHYSICAL EXAM: VITAL SIGNS: ED Triage Vitals  Enc Vitals Group     BP 06/24/21 0355 (!) 143/102     Pulse Rate 06/24/21 0355 (!) 155     Resp 06/24/21 0355 20     Temp 06/24/21 0355 97.6 F (36.4 C)     Temp Source 06/24/21 0355 Oral     SpO2 06/24/21 0355 98 %     Weight 06/24/21 0625 (!) 312 lb 3.2 oz (141.6 kg)     Height 06/24/21 0625 6\' 2"  (1.88 m)   Physical  Exam Vitals and nursing note reviewed.  Constitutional:      Appearance: He is well-developed.  HENT:     Head: Normocephalic and atraumatic.  Cardiovascular:     Rate and Rhythm: Tachycardia present. Rhythm irregular.  Pulmonary:     Effort: Pulmonary effort is normal. No respiratory distress.     Breath sounds: Normal breath sounds.  Abdominal:     General: There is no distension.  Musculoskeletal:        General: Normal range of motion.     Cervical back: Normal range of motion.  Neurological:     Mental Status: He is alert.      ____________________________________________   LABS (all labs ordered are listed, but only abnormal results are displayed)  Labs Reviewed  BASIC METABOLIC PANEL - Abnormal; Notable for the following components:      Result Value   Sodium 134 (*)    Glucose, Bld 204 (*)    All other components within normal limits  CBC  MAGNESIUM  TROPONIN I (HIGH SENSITIVITY)  TROPONIN I (HIGH SENSITIVITY)   ____________________________________________  EKG   EKG Interpretation  Date/Time:  Saturday June 24 2021 03:58:35 EST Ventricular Rate:  158 PR Interval:    QRS  Duration: 90 QT Interval:  306 QTC Calculation: 496 R Axis:   77 Text Interpretation: Atrial fibrillation with rapid ventricular response ST & T wave abnormality, consider inferolateral ischemia Abnormal ECG When compared with ECG of 29-Jul-2017 01:51, PREVIOUS ECG IS PRESENT Confirmed by Merrily Pew [phone]) on 06/25/2021 6:49:51 AM        EKG Interpretation  Date/Time:  Saturday June 24 2021 03:58:35 EST Ventricular Rate:  158 PR Interval:    QRS Duration: 90 QT Interval:  306 QTC Calculation: 496 R Axis:   77 Text Interpretation: Atrial fibrillation with rapid ventricular response ST & T wave abnormality, consider inferolateral ischemia Abnormal ECG When compared with ECG of 29-Jul-2017 01:51, PREVIOUS ECG IS PRESENT Confirmed by Merrily Pew [phone]) on 06/25/2021  6:49:51 AM         ____________________________________________  RADIOLOGY  No results found. ____________________________________________  PROCEDURES  Procedure(s) performed:   .Critical Care Performed by: Merrily Pew, MD Authorized by: Merrily Pew, MD   Critical care provider statement:    Critical care time (minutes):  32   Critical care time was exclusive of:  Separately billable procedures and treating other patients and teaching time   Critical care was necessary to treat or prevent imminent or life-threatening deterioration of the following conditions:  Circulatory failure   Critical care was time spent personally by me on the following activities:  Development of treatment plan with patient or surrogate, evaluation of patient's response to treatment, examination of patient, obtaining history from patient or surrogate, review of old charts, re-evaluation of patient's condition, pulse oximetry, ordering and performing treatments and interventions and ordering and review of laboratory studies .Cardioversion  Date/Time: 06/25/2021 6:50 AM Performed by: Merrily Pew, MD Authorized by: Merrily Pew, MD   Consent:    Consent obtained:  Written   Consent given by:  Patient   Risks discussed:  Cutaneous burn, induced arrhythmia, pain and death   Alternatives discussed:  No treatment, rate-control medication, alternative treatment, delayed treatment, anti-coagulation medication, observation and referral Pre-procedure details:    Cardioversion basis:  Emergent   Rhythm:  Atrial fibrillation   Electrode placement:  Anterior-posterior Patient sedated: Yes. Refer to sedation procedure documentation for details of sedation.  Attempt one:    Waveform:  Biphasic   Shock (Joules):  120   Shock outcome:  Conversion to normal sinus rhythm Post-procedure details:    Patient status:  Awake   Patient tolerance of procedure:  Tolerated well, no immediate  complications .Sedation  Date/Time: 06/25/2021 6:51 AM Performed by: Merrily Pew, MD Authorized by: Merrily Pew, MD   Consent:    Consent obtained:  Written   Consent given by:  Patient   Risks discussed:  Allergic reaction, prolonged hypoxia resulting in organ damage, prolonged sedation necessitating reversal, dysrhythmia, inadequate sedation, respiratory compromise necessitating ventilatory assistance and intubation, vomiting and nausea Universal protocol:    Procedure explained and questions answered to patient or proxy's satisfaction: yes     Relevant documents present and verified: yes     Test results available: yes     Immediately prior to procedure, a time out was called: yes     Patient identity confirmed:  Arm band Indications:    Procedure performed:  Cardioversion   Procedure necessitating sedation performed by:  Physician performing sedation Pre-sedation assessment:    Time since last food or drink:  6 hours   ASA classification: class 2 - patient with mild systemic disease     Mouth opening:  3 or more finger widths   Thyromental distance:  4 finger widths   Mallampati score:  II - soft palate, uvula, fauces visible   Neck mobility: reduced     Pre-sedation assessments completed and reviewed: airway patency, cardiovascular function, hydration status, mental status, nausea/vomiting, pain level, respiratory function and temperature   Immediate pre-procedure details:    Reassessment: Patient reassessed immediately prior to procedure     Reviewed: vital signs, relevant labs/tests and NPO status     Verified: bag valve mask available, emergency equipment available, intubation equipment available, IV patency confirmed, oxygen available and reversal medications available   Procedure details (see MAR for exact dosages):    Preoxygenation:  Nasal cannula   Sedation:  Propofol   Intended level of sedation: deep   Analgesia:  None   Intra-procedure monitoring:  Cardiac  monitor, blood pressure monitoring, continuous pulse oximetry, continuous capnometry, frequent LOC assessments and frequent vital sign checks   Intra-procedure events: none     Total Provider sedation time (minutes):  15 Post-procedure details:    Attendance: Constant attendance by certified staff until patient recovered     Recovery: Patient returned to pre-procedure baseline     Post-sedation assessments completed and reviewed: airway patency, cardiovascular function, hydration status, mental status, nausea/vomiting, pain level, respiratory function and temperature     Patient is stable for discharge or admission: yes     Procedure completion:  Tolerated well, no immediate complications ____________________________________________  INITIAL IMPRESSION / ASSESSMENT AND PLAN   This patient presents to the ED for concern of chest pain and palpitations, this involves an extensive number of treatment options, and is a complaint that carries with it a high risk of complications and morbidity.  The differential diagnosis includes ventricular fibrillation, ventricular tachycardia, SVT, sinus tachycardia, ACS. EKG looks to be obvious atrial fibrillation with rapid ventricular response.  Discussed options with him he elected to try a bolus of diltiazem first if that did not work then proceed with electrical cardioversion ending lab results.  Additional history obtained:  Additional history obtained from fianc at bedside Previous records obtained and reviewed in epic  Co morbidities that complicate the patient evaluation  Obesity, diabetes  Social Determinants of Health:  N/A  ED Course  Images ordered viewed and obtained by myself. Agree with Radiology interpretation. Details in ED course.  Labs ordered reviewed by myself as detailed in ED course.  Consultations obtained/considered detailed in ED course.        Cardiac Monitoring:  The patient was maintained on a cardiac monitor.  I  personally viewed and interpreted the cardiac monitored which showed an underlying rhythm of: Atrial fibrillation with rapid ventricular response and subsequently sinus rhythm after cardioversion.  CRITICAL INTERVENTIONS:  Sedation Cardioversion Anticoagulation  CHA2DS2/VAS Stroke Risk Points  Current as of 4 hours ago     2 >= 2 Points: High Risk  1 - 1.99 Points: Medium Risk  0 Points: Low Risk    No Change      Details    This score determines the patient's risk of having a stroke if the  patient has atrial fibrillation.       Points Metrics  0 Has Congestive Heart Failure:  No    Current as of 4 hours ago  0 Has Vascular Disease:  No    Current as of 4 hours ago  1 Has Hypertension:  Yes     Current as of 4 hours ago  0 Age:  50    Current as of 4 hours ago  1 Has Diabetes:  Yes    Current as of 4 hours ago  0 Had Stroke:  No  Had TIA:  No  Had Thromboembolism:  No    Current as of 4 hours ago  0 Male:  No    Current as of 4 hours ago            Reevaluation:  After the interventions noted above, I reevaluated the patient and found that they have :improved.  After sedation and cardioversion patient in a sinus rhythm for well over an hour.  Feels better.  Troponin is negative.  Electrolytes are normal.  CHADS2 score of 2 so we will start anticoagulation prior to following up with A-fib clinic.  Patient stable for discharge with sedation precautions provided.   FINAL IMPRESSION AND PLAN Final diagnoses:  Atrial fibrillation with rapid ventricular response ([place])    A medical screening exam was performed and I feel the patient has had an appropriate workup for their chief complaint at this time and likelihood of emergent condition existing is low. They have been counseled on decision, DISCHARGE, follow up and which symptoms necessitate immediate return to the emergency department. They or their family verbally stated understanding and agreement with plan and  discharged in stable condition.   ____________________________________________   NEW OUTPATIENT MEDICATIONS STARTED DURING THIS VISIT:  Discharge Medication List as of 06/24/2021  7:22 AM     START taking these medications   Details  rivaroxaban (XARELTO) 20 MG TABS tablet Take 1 tablet (20 mg total) by mouth daily with supper., Starting Sat 06/24/2021, Normal        Note:  This note was prepared with assistance of Dragon voice recognition software. Occasional wrong-word or sound-a-like substitutions may have occurred due to the inherent limitations of voice recognition software.    Shaylea Ucci, Corene Cornea, MD 06/25/21 [phone]

## 2021-06-29 ENCOUNTER — Telehealth: Payer: Self-pay

## 2021-06-29 NOTE — Telephone Encounter (Signed)
Pt. Called wanting to know if you could call him in Viagra to [facility] [address]. He said the Cialis was not covered by his ins.  ?

## 2021-06-30 ENCOUNTER — Other Ambulatory Visit: Payer: Self-pay | Admitting: Medical

## 2021-06-30 ENCOUNTER — Encounter (HOSPITAL_COMMUNITY): Payer: Self-pay | Admitting: Physician Assistant

## 2021-06-30 ENCOUNTER — Other Ambulatory Visit: Payer: Self-pay

## 2021-06-30 ENCOUNTER — Ambulatory Visit (HOSPITAL_COMMUNITY)
Admission: RE | Admit: 2021-06-30 | Discharge: 2021-06-30 | Disposition: A | Discharge status: Home / Self Care | Payer: 59 | Source: Ambulatory Visit | Attending: Physician Assistant | Admitting: Physician Assistant

## 2021-06-30 VITALS — BP 128/78 | HR 76 | Ht 74.0 in | Wt 319.0 lb

## 2021-06-30 DIAGNOSIS — Z7182 Exercise counseling: Secondary | ICD-10-CM | POA: Insufficient documentation

## 2021-06-30 DIAGNOSIS — E1136 Type 2 diabetes mellitus with diabetic cataract: Secondary | ICD-10-CM | POA: Diagnosis not present

## 2021-06-30 DIAGNOSIS — R0683 Snoring: Secondary | ICD-10-CM | POA: Diagnosis not present

## 2021-06-30 DIAGNOSIS — I480 Paroxysmal atrial fibrillation: Secondary | ICD-10-CM | POA: Insufficient documentation

## 2021-06-30 DIAGNOSIS — Z7901 Long term (current) use of anticoagulants: Secondary | ICD-10-CM | POA: Insufficient documentation

## 2021-06-30 DIAGNOSIS — F1090 Alcohol use, unspecified, uncomplicated: Secondary | ICD-10-CM | POA: Insufficient documentation

## 2021-06-30 DIAGNOSIS — G4739 Other sleep apnea: Secondary | ICD-10-CM | POA: Insufficient documentation

## 2021-06-30 DIAGNOSIS — F17210 Nicotine dependence, cigarettes, uncomplicated: Secondary | ICD-10-CM | POA: Diagnosis not present

## 2021-06-30 DIAGNOSIS — Z6841 Body Mass Index (BMI) 40.0 and over, adult: Secondary | ICD-10-CM | POA: Insufficient documentation

## 2021-06-30 DIAGNOSIS — E785 Hyperlipidemia, unspecified: Secondary | ICD-10-CM | POA: Insufficient documentation

## 2021-06-30 DIAGNOSIS — E669 Obesity, unspecified: Secondary | ICD-10-CM | POA: Diagnosis not present

## 2021-06-30 MED ORDER — SILDENAFIL CITRATE 20 MG PO TABS: ORAL_TABLET | ORAL | 2 refills | Status: DC

## 2021-06-30 MED ORDER — SILDENAFIL CITRATE 100 MG PO TABS
50.0000 mg | ORAL_TABLET | ORAL | 1 refills | Status: DC | PRN
Start: 2021-06-30 — End: 2021-07-18

## 2021-06-30 NOTE — Telephone Encounter (Signed)
Pt. Aware.

## 2021-06-30 NOTE — Patient Instructions (Addendum)
Continue Xarelto until current prescription is finished then stop ?

## 2021-06-30 NOTE — Progress Notes (Signed)
? ? ?Primary Care Physician: Carlena Hurl, PA-C ?Primary Cardiologist: none ?Primary Electrophysiologist: none ?Referring Physician: [facility] ? ? ?Gabriel Cook is a 51 y.o. male with a history of HLD, DM, tobacco abuse, atrial fibrillation who presents for consultation in the [facility].  The patient was initially diagnosed with atrial fibrillation 06/24/21 after presenting to the ED with symptoms of chest pain and palpitations. ECG showed afib with RVR. He underwent DCCV and was started on Xarelto for a CHADS2VASC score of 1. He does drink 0000000 alcoholic drinks weekly. He also admits to snoring and witnessed apnea.  ? ?Today, he denies symptoms of palpitations, chest pain, shortness of breath, orthopnea, PND, lower extremity edema, dizziness, presyncope, syncope, snoring, daytime somnolence, bleeding, or neurologic sequela. The patient is tolerating medications without difficulties and is otherwise without complaint today.  ? ? ?Atrial Fibrillation Risk Factors: ? ?he does have symptoms or diagnosis of sleep apnea. ?he is agreeable to sleep study.  ?he does not have a history of rheumatic fever. ?he does have a history of alcohol use. ?The patient does not have a history of early familial atrial fibrillation or other arrhythmias. ? ?he has a BMI of Body mass index is 40.96 kg/m?Marland KitchenMarland Kitchen ?Filed Weights  ? 06/30/21 1129  ?Weight: (!) 144.7 kg  ? ? ?Family History  ?Problem Relation Age of Onset  ? Cancer Mother   ?     ?  ? Diabetes Mother   ? Hypertension Mother   ? Diabetes Father   ? Heart disease Neg Hx   ? Stroke Neg Hx   ? ? ? ?Atrial Fibrillation Management history: ? ?Previous antiarrhythmic drugs: none ?Previous cardioversions: 06/24/21 ?Previous ablations: none ?CHADS2VASC score: 1 ?Anticoagulation history: Xarelto  ? ? ?Past Medical History:  ?Diagnosis Date  ? Cataract   ? Diabetes mellitus without complication ([place]) 99991111  ? ?Past Surgical History:  ?Procedure Laterality  Date  ? CATARACT EXTRACTION    ? ? ?Current Outpatient Medications  ?Medication Sig Dispense Refill  ? prednisoLONE acetate (PRED FORTE) 1 % ophthalmic suspension PLEASE SEE ATTACHED FOR DETAILED DIRECTIONS    ? rivaroxaban (XARELTO) 20 MG TABS tablet Take 1 tablet (20 mg total) by mouth daily with supper. 30 tablet 0  ? rosuvastatin (CRESTOR) 10 MG tablet Take 1 tablet (10 mg total) by mouth daily. 90 tablet 3  ? Semaglutide, 1 MG/DOSE, (OZEMPIC, 1 MG/DOSE,) 4 MG/3ML SOPN Inject 1 mg into the skin once a week. 3 mL 5  ? sildenafil (VIAGRA) 100 MG tablet Take 0.5 tablets (50 mg total) by mouth as needed for erectile dysfunction. 20 tablet 1  ? terbinafine (LAMISIL AT) 1 % cream Apply 1 application topically 2 (two) times daily. 30 g 1  ? ?No current facility-administered medications for this encounter.  ? ? ?No Known Allergies ? ?Social History  ? ?Socioeconomic History  ? Marital status: Married  ?  Spouse name: Not on file  ? Number of children: Not on file  ? Years of education: Not on file  ? Highest education level: Not on file  ?Occupational History  ? Not on file  ?Tobacco Use  ? Smoking status: Every Day  ?  Packs/day: 0.25  ?  Years: 30.00  ?  Pack years: 7.50  ?  Types: Cigarettes  ? Smokeless tobacco: Never  ? Tobacco comments:  ?  3 cigarettes daily 06/30/21  ?Substance and Sexual Activity  ? Alcohol use: Yes  ?  Alcohol/week: 12.0 - 16.0 standard drinks  ?  Types: 12 - 16 Cans of beer per week  ?  Comment: 4 beers in a sitting 3-4 times week 06/30/21  ? Drug use: Not Currently  ? Sexual activity: Not on file  ?Other Topics Concern  ? Not on file  ?Social History Narrative  ? Lives alone . Walks for exercise.  2 children.   Works as Education officer, [employer].  05/2021  ? ?Social Determinants of Health  ? ?Financial Resource Strain: Not on file  ?Food Insecurity: Not on file  ?Transportation Needs: Not on file  ?Physical Activity: Not on file  ?Stress: Not on file  ?Social Connections: Not on file  ?Intimate Partner  Violence: Not on file  ? ? ? ?ROS- All systems are reviewed and negative except as per the HPI above. ? ?Physical Exam: ?Vitals:  ? 06/30/21 1129  ?BP: 128/78  ?Pulse: 76  ?Weight: (!) 144.7 kg  ?Height: 6\' 2"  (1.88 m)  ? ? ?GEN- The patient is a well appearing obese male, alert and oriented x 3 today.   ?Head- normocephalic, atraumatic ?Eyes-  Sclera clear, conjunctiva pink ?Ears- hearing intact ?Oropharynx- clear ?Neck- supple  ?Lungs- Clear to ausculation bilaterally, normal work of breathing ?Heart- Regular rate and rhythm, no murmurs, rubs or gallops  ?GI- soft, NT, ND, + BS ?Extremities- no clubbing, cyanosis, or edema ?MS- no significant deformity or atrophy ?Skin- no rash or lesion ?Psych- euthymic mood, full affect ?Neuro- strength and sensation are intact ? ?Wt Readings from Last 3 Encounters:  ?06/30/21 (!) 144.7 kg  ?06/24/21 (!) 141.6 kg  ?06/07/21 (!) 141.6 kg  ? ? ?EKG today demonstrates  ?SR ?Vent. rate 76 BPM ?PR interval 168 ms ?QRS duration 94 ms ?QT/QTcB 376/423 ms ? ? ?Epic records are reviewed at length today ? ?CHA2DS2-VASc Score = 1  ?The patient's score is based upon: ?CHF History: 0 ?HTN History: 0 ?Diabetes History: 1 ?Stroke History: 0 ?Vascular Disease History: 0 ?Age Score: 0 ?Gender Score: 0 ?    ? ? ?ASSESSMENT AND PLAN: ?1. Paroxysmal Atrial Fibrillation (ICD10:  I48.0) ?The patient's CHA2DS2-VASc score is 1, indicating a 0.6% annual risk of stroke.   ?S/p DCCV on 06/24/21 ?General education about afib provided and questions answered. We also discussed his stroke risk and the risks and benefits of anticoagulation. ?Continue Xarelto 20 mg daily for at least one month post DCCV.  ?Check echocardiogram ? ?2. Obesity ?Body mass index is 40.96 kg/m?. ?Lifestyle modification was discussed at length including regular exercise and weight reduction. ? ?3. Snoring/witnessed apnea  ?The importance of adequate treatment of sleep apnea was discussed today in order to improve our ability to  maintain sinus rhythm long term. ?Will refer for sleep study.  ? ? ?Follow up in the AF clinic in one month.  ? ? ?Gabriel Catia Todorov PA-C ?Afib Clinic ?[facility] ?[address] ?[place], [place] [address] ?[phone] ?06/30/2021 ?12:31 PM ? ?

## 2021-06-30 NOTE — Telephone Encounter (Signed)
PT. Aware he said he tried to pick it up at the pharmacy and it was 200 dollars. I told these kinds of medication were not covered by the ins and to try to find a discount card at the pharmacy possibly good RX. He said he was going to try that.  ?

## 2021-07-11 ENCOUNTER — Ambulatory Visit (HOSPITAL_COMMUNITY): Admission: RE | Admit: 2021-07-11 | Payer: 59 | Source: Ambulatory Visit

## 2021-07-13 ENCOUNTER — Other Ambulatory Visit: Payer: 59

## 2021-07-17 ENCOUNTER — Telehealth: Payer: Self-pay

## 2021-07-17 NOTE — Telephone Encounter (Signed)
Pt. Called stating the generic Viagra didn't work for him and he wanted to know if you could call in the cialis for him to [address]  ?

## 2021-07-18 ENCOUNTER — Other Ambulatory Visit: Payer: Self-pay | Admitting: Medical

## 2021-07-18 MED ORDER — TADALAFIL 20 MG PO TABS: 20.0000 mg | ORAL_TABLET | Freq: Every day | ORAL | 0 refills | Status: DC | PRN

## 2021-08-02 ENCOUNTER — Encounter (HOSPITAL_COMMUNITY): Payer: Self-pay

## 2021-08-02 ENCOUNTER — Ambulatory Visit (HOSPITAL_COMMUNITY): Admission: RE | Admit: 2021-08-02 | Payer: Self-pay | Source: Ambulatory Visit

## 2021-08-02 ENCOUNTER — Ambulatory Visit (HOSPITAL_COMMUNITY): Payer: 59 | Admitting: Physician Assistant

## 2021-08-02 NOTE — Progress Notes (Incomplete)
? ? ?Primary Care Physician: Carlena Hurl, PA-C ?Primary Cardiologist: none ?Primary Electrophysiologist: none ?Referring Physician: [facility] ? ? ?Gabriel Cook is a 51 y.o. male with a history of HLD, DM, tobacco abuse, atrial fibrillation who presents for follow up in the [facility].  The patient was initially diagnosed with atrial fibrillation 06/24/21 after presenting to the ED with symptoms of chest pain and palpitations. ECG showed afib with RVR. He underwent DCCV and was started on Xarelto for a CHADS2VASC score of 1. He does drink 0000000 alcoholic drinks weekly. He also admits to snoring and witnessed apnea.  ? ?On follow up today, *** ? ?Today, he denies symptoms of ***palpitations, chest pain, shortness of breath, orthopnea, PND, lower extremity edema, dizziness, presyncope, syncope, snoring, daytime somnolence, bleeding, or neurologic sequela. The patient is tolerating medications without difficulties and is otherwise without complaint today.  ? ? ?Atrial Fibrillation Risk Factors: ? ?he does have symptoms or diagnosis of sleep apnea. ?he is agreeable to sleep study.  ?he does not have a history of rheumatic fever. ?he does have a history of alcohol use. ?The patient does not have a history of early familial atrial fibrillation or other arrhythmias. ? ?he has a BMI of There is no height or weight on file to calculate BMI.Marland Kitchen ?There were no vitals filed for this visit. ? ? ?Family History  ?Problem Relation Age of Onset  ? Cancer Mother   ?     ?  ? Diabetes Mother   ? Hypertension Mother   ? Diabetes Father   ? Heart disease Neg Hx   ? Stroke Neg Hx   ? ? ? ?Atrial Fibrillation Management history: ? ?Previous antiarrhythmic drugs: none ?Previous cardioversions: 06/24/21 ?Previous ablations: none ?CHADS2VASC score: 1 ?Anticoagulation history: Xarelto  ? ? ?Past Medical History:  ?Diagnosis Date  ? Cataract   ? Diabetes mellitus without complication ([place]) 99991111  ? ?Past  Surgical History:  ?Procedure Laterality Date  ? CATARACT EXTRACTION    ? ? ?Current Outpatient Medications  ?Medication Sig Dispense Refill  ? prednisoLONE acetate (PRED FORTE) 1 % ophthalmic suspension PLEASE SEE ATTACHED FOR DETAILED DIRECTIONS    ? rivaroxaban (XARELTO) 20 MG TABS tablet Take 1 tablet (20 mg total) by mouth daily with supper. 30 tablet 0  ? rosuvastatin (CRESTOR) 10 MG tablet Take 1 tablet (10 mg total) by mouth daily. 90 tablet 3  ? Semaglutide, 1 MG/DOSE, (OZEMPIC, 1 MG/DOSE,) 4 MG/3ML SOPN Inject 1 mg into the skin once a week. 3 mL 5  ? tadalafil (CIALIS) 20 MG tablet Take 1 tablet (20 mg total) by mouth daily as needed for erectile dysfunction. 10 tablet 0  ? terbinafine (LAMISIL AT) 1 % cream Apply 1 application topically 2 (two) times daily. 30 g 1  ? ?No current facility-administered medications for this visit.  ? ? ?No Known Allergies ? ?Social History  ? ?Socioeconomic History  ? Marital status: Married  ?  Spouse name: Not on file  ? Number of children: Not on file  ? Years of education: Not on file  ? Highest education level: Not on file  ?Occupational History  ? Not on file  ?Tobacco Use  ? Smoking status: Every Day  ?  Packs/day: 0.25  ?  Years: 30.00  ?  Pack years: 7.50  ?  Types: Cigarettes  ? Smokeless tobacco: Never  ? Tobacco comments:  ?  3 cigarettes daily 06/30/21  ?Substance and Sexual  Activity  ? Alcohol use: Yes  ?  Alcohol/week: 12.0 - 16.0 standard drinks  ?  Types: 12 - 16 Cans of beer per week  ?  Comment: 4 beers in a sitting 3-4 times week 06/30/21  ? Drug use: Not Currently  ? Sexual activity: Not on file  ?Other Topics Concern  ? Not on file  ?Social History Narrative  ? Lives alone . Walks for exercise.  2 children.   Works as Education officer, museum.  05/2021  ? ?Social Determinants of Health  ? ?Financial Resource Strain: Not on file  ?Food Insecurity: Not on file  ?Transportation Needs: Not on file  ?Physical Activity: Not on file  ?Stress: Not on file  ?Social  Connections: Not on file  ?Intimate Partner Violence: Not on file  ? ? ? ?ROS- All systems are reviewed and negative except as per the HPI above. ? ?Physical Exam: ?There were no vitals filed for this visit. ? ?GEN- The patient is a well appearing *** {Desc; male/male:11659}, alert and oriented x 3 today.   ?HEENT-head normocephalic, atraumatic, sclera clear, conjunctiva pink, hearing intact, trachea midline. ?Lungs- Clear to ausculation bilaterally, normal work of breathing ?Heart- ***Regular rate and rhythm, no murmurs, rubs or gallops  ?GI- soft, NT, ND, + BS ?Extremities- no clubbing, cyanosis, or edema ?MS- no significant deformity or atrophy ?Skin- no rash or lesion ?Psych- euthymic mood, full affect ?Neuro- strength and sensation are intact ? ? ?Wt Readings from Last 3 Encounters:  ?06/30/21 (!) 144.7 kg  ?06/24/21 (!) 141.6 kg  ?06/07/21 (!) 141.6 kg  ? ? ?EKG today demonstrates  ?*** ? ? ?Epic records are reviewed at length today ? ?CHA2DS2-VASc Score = 1  ?The patient's score is based upon: ?CHF History: 0 ?HTN History: 0 ?Diabetes History: 1 ?Stroke History: 0 ?Vascular Disease History: 0 ?Age Score: 0 ?Gender Score: 0 ?    ? ? ?ASSESSMENT AND PLAN: ?1. Paroxysmal Atrial Fibrillation (ICD10:  I48.0) ?The patient's CHA2DS2-VASc score is 1, indicating a 0.6% annual risk of stroke.   ?***stop xarelto ?Continue Xarelto 20 mg daily for at least one month post DCCV. ?Echo today, results pending.  ? ?2. Obesity ?There is no height or weight on file to calculate BMI. ?Lifestyle modification was discussed and encouraged including regular physical activity and weight reduction. ?*** ? ?3. Snoring/witnessed apnea  ?Referred for sleep study. ?*** ? ? ?Follow up *** ? ? ?Ricky Amorette Charrette PA-C ?[facility] ?[facility] ?[address] ?[place], [place] [address] ?[phone] ?08/02/2021 ?8:20 AM ? ?

## 2021-08-11 ENCOUNTER — Telehealth: Payer: Self-pay | Admitting: Internal Medicine

## 2021-08-11 NOTE — Telephone Encounter (Signed)
Working on [employer] for Ozempic for patient by phone ?

## 2021-08-11 NOTE — Telephone Encounter (Signed)
[employer] plan expired on 07/15/21 so medication for ozempic can not be completed through [facility].  ? ?I have tried to call pt but VM is full. Need updated insurance information if he has insurance  ?

## 2021-08-16 ENCOUNTER — Telehealth: Payer: Self-pay

## 2021-08-16 NOTE — Telephone Encounter (Signed)
P.A. SILDENAFIL  

## 2021-08-21 ENCOUNTER — Telehealth: Payer: Self-pay | Admitting: *Deleted

## 2021-08-21 NOTE — Telephone Encounter (Signed)
Made several attempts to contact the patient to verify his insurance but the voicemail is full. One attempt was made to call the home phone but no did not get an answer or voicemail. ?

## 2021-10-17 ENCOUNTER — Telehealth: Payer: Self-pay | Admitting: Medical

## 2021-10-17 ENCOUNTER — Ambulatory Visit: Payer: 59 | Admitting: Medical

## 2021-10-17 ENCOUNTER — Encounter: Payer: Self-pay | Admitting: Family Medicine

## 2021-10-17 NOTE — Telephone Encounter (Signed)
This patient no showed for their appointment today.Which of the following is necessary for this patient.   A) No follow-up necessary   B) Follow-up urgent. Locate Patient Immediately.   C) Follow-up necessary. Contact patient and Schedule visit in ____ Days.   D) Follow-up Advised. Contact patient and Schedule visit in ____ Days.   E) Please Send no show letter to patient. Charge no show fee if no show was a CPE.    No-show today for med check.  There are several prior no shows in the chart to cardiology and here.  If there has been at least 2 or 3 no-shows it may be time to dismiss or at the very least send a bill for the no-show fee

## 2021-10-17 NOTE — Telephone Encounter (Signed)
Dismissal sent 

## 2021-12-01 ENCOUNTER — Emergency Department (HOSPITAL_COMMUNITY): Payer: No Typology Code available for payment source

## 2021-12-01 ENCOUNTER — Emergency Department (HOSPITAL_COMMUNITY)
Admission: EM | Admit: 2021-12-01 | Discharge: 2021-12-01 | Disposition: A | Discharge status: Home / Self Care | Payer: No Typology Code available for payment source | Attending: Emergency Medicine | Admitting: Emergency Medicine

## 2021-12-01 ENCOUNTER — Other Ambulatory Visit: Payer: Self-pay

## 2021-12-01 DIAGNOSIS — S3991XA Unspecified injury of abdomen, initial encounter: Secondary | ICD-10-CM | POA: Diagnosis present

## 2021-12-01 DIAGNOSIS — S30811A Abrasion of abdominal wall, initial encounter: Secondary | ICD-10-CM | POA: Diagnosis not present

## 2021-12-01 DIAGNOSIS — R200 Anesthesia of skin: Secondary | ICD-10-CM | POA: Insufficient documentation

## 2021-12-01 LAB — COMPREHENSIVE METABOLIC PANEL
ALT: 57 U/L — ABNORMAL HIGH (ref 0–44)
AST: 52 U/L — ABNORMAL HIGH (ref 15–41)
Albumin: 3.4 g/dL — ABNORMAL LOW (ref 3.5–5.0)
Alkaline Phosphatase: 70 U/L (ref 38–126)
Anion gap: 12 (ref 5–15)
BUN: 10 mg/dL (ref 6–20)
CO2: 22 mmol/L (ref 22–32)
Calcium: 8.9 mg/dL (ref 8.9–10.3)
Chloride: 103 mmol/L (ref 98–111)
Creatinine, Ser: 0.96 mg/dL (ref 0.61–1.24)
GFR, Estimated: >60 mL/min (ref >60)
Glucose, Bld: 181 mg/dL — ABNORMAL HIGH (ref 70–99)
Potassium: 4.5 mmol/L (ref 3.5–5.1)
Sodium: 137 mmol/L (ref 135–145)
Total Bilirubin: 0.9 mg/dL (ref 0.3–1.2)
Total Protein: 6.4 g/dL — ABNORMAL LOW (ref 6.5–8.1)

## 2021-12-01 LAB — SAMPLE TO BLOOD BANK

## 2021-12-01 LAB — I-STAT CHEM 8, ED
BUN: 11 mg/dL (ref 6–20)
Calcium, Ion: 1.05 mmol/L — ABNORMAL LOW (ref 1.15–1.40)
Chloride: 104 mmol/L (ref 98–111)
Creatinine, Ser: 0.9 mg/dL (ref 0.61–1.24)
Glucose, Bld: 180 mg/dL — ABNORMAL HIGH (ref 70–99)
HCT: 48 % (ref 39.0–52.0)
Hemoglobin: 16.3 g/dL (ref 13.0–17.0)
Potassium: 4.5 mmol/L (ref 3.5–5.1)
Sodium: 138 mmol/L (ref 135–145)
TCO2: 25 mmol/L (ref 22–32)

## 2021-12-01 LAB — CBC
HCT: 48.1 % (ref 39.0–52.0)
Hemoglobin: 15.8 g/dL (ref 13.0–17.0)
MCH: 29.5 pg (ref 26.0–34.0)
MCHC: 32.8 g/dL (ref 30.0–36.0)
MCV: 89.9 fL (ref 80.0–100.0)
Platelets: 204 10*3/uL (ref 150–400)
RBC: 5.35 MIL/uL (ref 4.22–5.81)
RDW: 13.3 % (ref 11.5–15.5)
WBC: 6.3 10*3/uL (ref 4.0–10.5)
nRBC: 0 % (ref 0.0–0.2)

## 2021-12-01 LAB — ETHANOL: Alcohol, Ethyl (B): <10 mg/dL (ref <10)

## 2021-12-01 LAB — PROTIME-INR
INR: 1.1 (ref 0.8–1.2)
Prothrombin Time: 13.9 seconds (ref 11.4–15.2)

## 2021-12-01 LAB — TROPONIN I (HIGH SENSITIVITY): Troponin I (High Sensitivity): 6 ng/L (ref <18)

## 2021-12-01 LAB — LACTIC ACID, PLASMA: Lactic Acid, Venous: 2.2 mmol/L (ref 0.5–1.9)

## 2021-12-01 MED ORDER — FLUORESCEIN SODIUM 1 MG OP STRP
1.0000 | ORAL_STRIP | Freq: Once | OPHTHALMIC | Status: DC
  Filled 2021-12-01: qty 1

## 2021-12-01 MED ORDER — OXYCODONE-ACETAMINOPHEN 5-325 MG PO TABS
1.0000 | ORAL_TABLET | Freq: Once | ORAL | Status: AC
  Administered 2021-12-01: 1 via ORAL
  Filled 2021-12-01: qty 1

## 2021-12-01 MED ORDER — TETRACAINE HCL 0.5 % OP SOLN
1.0000 [drp] | Freq: Once | OPHTHALMIC | Status: DC
  Filled 2021-12-01: qty 4

## 2021-12-01 MED ORDER — ONDANSETRON HCL 4 MG/2ML IJ SOLN
4.0000 mg | Freq: Once | INTRAMUSCULAR | Status: AC
  Administered 2021-12-01: 4 mg via INTRAVENOUS
  Filled 2021-12-01: qty 2

## 2021-12-01 MED ORDER — FENTANYL CITRATE PF 50 MCG/ML IJ SOSY
50.0000 ug | PREFILLED_SYRINGE | Freq: Once | INTRAMUSCULAR | Status: AC
  Administered 2021-12-01: 50 ug via INTRAVENOUS
  Filled 2021-12-01: qty 1

## 2021-12-01 MED ORDER — SODIUM CHLORIDE 0.9 % IV BOLUS
1000.0000 mL | Freq: Once | INTRAVENOUS | Status: AC
  Administered 2021-12-01: 1000 mL via INTRAVENOUS

## 2021-12-01 MED ORDER — IOHEXOL 300 MG/ML  SOLN
75.0000 mL | Freq: Once | INTRAMUSCULAR | Status: AC | PRN
  Administered 2021-12-01: 75 mL via INTRAVENOUS
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
## 2021-12-01 NOTE — ED Notes (Signed)
The pt is alert asking for water  family at  the bedside
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
## 2022-01-03 ENCOUNTER — Encounter: Payer: Self-pay | Admitting: Internal Medicine

## 2022-02-06 ENCOUNTER — Encounter: Payer: Self-pay | Admitting: Internal Medicine

## 2022-05-11 ENCOUNTER — Emergency Department (HOSPITAL_COMMUNITY): Payer: Medicaid Other

## 2022-05-11 ENCOUNTER — Emergency Department (HOSPITAL_COMMUNITY)
Admission: EM | Admit: 2022-05-11 | Discharge: 2022-05-11 | Disposition: A | Discharge status: Home / Self Care | Payer: Medicaid Other | Attending: Student | Admitting: Student

## 2022-05-11 ENCOUNTER — Encounter (HOSPITAL_COMMUNITY): Payer: Self-pay

## 2022-05-11 ENCOUNTER — Other Ambulatory Visit: Payer: Self-pay

## 2022-05-11 DIAGNOSIS — M25569 Pain in unspecified knee: Secondary | ICD-10-CM

## 2022-05-11 DIAGNOSIS — I10 Essential (primary) hypertension: Secondary | ICD-10-CM | POA: Diagnosis not present

## 2022-05-11 DIAGNOSIS — W19XXXA Unspecified fall, initial encounter: Secondary | ICD-10-CM | POA: Diagnosis not present

## 2022-05-11 DIAGNOSIS — M25462 Effusion, left knee: Secondary | ICD-10-CM | POA: Insufficient documentation

## 2022-05-11 DIAGNOSIS — Z7901 Long term (current) use of anticoagulants: Secondary | ICD-10-CM | POA: Insufficient documentation

## 2022-05-11 DIAGNOSIS — E119 Type 2 diabetes mellitus without complications: Secondary | ICD-10-CM | POA: Diagnosis not present

## 2022-05-11 DIAGNOSIS — M25562 Pain in left knee: Secondary | ICD-10-CM | POA: Diagnosis not present

## 2022-05-11 DIAGNOSIS — F17210 Nicotine dependence, cigarettes, uncomplicated: Secondary | ICD-10-CM | POA: Diagnosis not present

## 2022-05-11 DIAGNOSIS — W06XXXA Fall from bed, initial encounter: Secondary | ICD-10-CM | POA: Diagnosis not present

## 2022-05-11 DIAGNOSIS — Z79899 Other long term (current) drug therapy: Secondary | ICD-10-CM | POA: Insufficient documentation

## 2022-05-11 DIAGNOSIS — S80919A Unspecified superficial injury of unspecified knee, initial encounter: Secondary | ICD-10-CM | POA: Diagnosis not present

## 2022-05-11 HISTORY — DX: Essential (primary) hypertension: I10

## 2022-05-11 MED ORDER — ACETAMINOPHEN 500 MG PO TABS
1000.0000 mg | ORAL_TABLET | Freq: Once | ORAL | Status: AC
  Administered 2022-05-11: 1000 mg via ORAL
  Filled 2022-05-11: qty 2

## 2022-05-11 NOTE — ED Provider Notes (Signed)
[facility] DEPT Provider Note  CSN: [id number] Arrival date & time: 05/11/22 5643  Chief Complaint(s) Fall  HPI Gabriel Cook is a 52 y.o. male with PMH T2DM, HTN, paroxysmal A-fib on Xarelto who presents emergency department for evaluation of left knee pain.  Patient states that he was getting out of bed and he felt like his left knee gave out prompting a fall onto his left knee.  There was no head strike or loss of consciousness.  Denies numbness, tingling, weakness or other neurologic complaints.  Denies chest pain, shortness of breath, headache, fever or other systemic complaints.   Past Medical History Past Medical History:  Diagnosis Date   Cataract    Diabetes mellitus without complication ([place]) 3295   Hypertension    Patient Active Problem List   Diagnosis Date Noted   Paroxysmal atrial fibrillation ([place]) 06/30/2021   Encounter for health maintenance examination in adult 06/07/2021   Screening for prostate cancer 06/07/2021   Screen for colon cancer 06/07/2021   Screening for lung cancer 06/07/2021   Screening for heart disease 06/07/2021   Screen for STD (sexually transmitted disease) 06/07/2021   Need for Tdap vaccination 06/07/2021   Need for pneumococcal vaccination 06/07/2021   Vaccine counseling 06/07/2021   Other fatigue 06/07/2021   Decreased urine stream 06/07/2021   Erectile dysfunction 06/07/2021   Urinary hesitancy 06/07/2021   BMI 40.0-44.9, adult ([place]) 06/07/2021   Enlarged and hypertrophic nails 06/07/2021   Tinea cruris 06/07/2021   Uncontrolled type 2 diabetes mellitus with hyperglycemia ([place]) 09/08/2020   Smoker 09/08/2020   Home Medication(s) Prior to Admission medications   Medication Sig Start Date End Date Taking? Authorizing Provider  prednisoLONE acetate (PRED FORTE) 1 % ophthalmic suspension PLEASE SEE ATTACHED FOR DETAILED DIRECTIONS 06/27/21   [provider]  rivaroxaban (XARELTO) 20 MG TABS  tablet Take 1 tablet (20 mg total) by mouth daily with supper. 06/24/21   Mesner, Corene Cornea, MD  rosuvastatin (CRESTOR) 10 MG tablet Take 1 tablet (10 mg total) by mouth daily. 06/08/21 06/08/22  Tysinger, Camelia Eng, PA-C  Semaglutide, 1 MG/DOSE, (OZEMPIC, 1 MG/DOSE,) 4 MG/3ML SOPN Inject 1 mg into the skin once a week. 06/07/21   Tysinger, Camelia Eng, PA-C  tadalafil (CIALIS) 20 MG tablet Take 1 tablet (20 mg total) by mouth daily as needed for erectile dysfunction. 07/18/21   Tysinger, Camelia Eng, PA-C  terbinafine (LAMISIL AT) 1 % cream Apply 1 application topically 2 (two) times daily. 06/07/21   Tysinger, Camelia Eng, PA-C                                                                                                                                    Past Surgical History Past Surgical History:  Procedure Laterality Date   CATARACT EXTRACTION     Family History Family History  Problem Relation Age of Onset   Cancer Mother        ?  Diabetes Mother    Hypertension Mother    Diabetes Father    Heart disease Neg Hx    Stroke Neg Hx     Social History Social History   Tobacco Use   Smoking status: Every Day    Packs/day: 0.25    Years: 30.00    Total pack years: 7.50    Types: Cigarettes   Smokeless tobacco: Never   Tobacco comments:    3 cigarettes daily 06/30/21  Substance Use Topics   Alcohol use: Yes    Alcohol/week: 12.0 - 16.0 standard drinks of alcohol    Types: 12 - 16 Cans of beer per week    Comment: 4 beers in a sitting 3-4 times week 06/30/21   Drug use: Not Currently   Allergies Patient has no known allergies.  Review of Systems Review of Systems  Musculoskeletal:  Positive for arthralgias.    Physical Exam Vital Signs  I have reviewed the triage vital signs BP (!) 194/112 (BP Location: Left Arm) Comment: RN notified  Pulse 66   Temp 98.8 F (37.1 C) (Oral)   Resp 20   SpO2 100%   Physical Exam Vitals and nursing note reviewed.  Constitutional:      General: He is  not in acute distress.    Appearance: He is well-developed.  HENT:     Head: Normocephalic and atraumatic.  Eyes:     Conjunctiva/sclera: Conjunctivae normal.  Cardiovascular:     Rate and Rhythm: Normal rate and regular rhythm.     Heart sounds: No murmur heard. Pulmonary:     Effort: Pulmonary effort is normal. No respiratory distress.     Breath sounds: Normal breath sounds.  Abdominal:     Palpations: Abdomen is soft.     Tenderness: There is no abdominal tenderness.  Musculoskeletal:        General: Tenderness present. No swelling.     Cervical back: Neck supple.  Skin:    General: Skin is warm and dry.     Capillary Refill: Capillary refill takes less than 2 seconds.  Neurological:     Mental Status: He is alert.  Psychiatric:        Mood and Affect: Mood normal.     ED Results and Treatments Labs (all labs ordered are listed, but only abnormal results are displayed) Labs Reviewed - No data to display                                                                                                                        Radiology DG Knee Complete 4 Views Left  Result Date: 05/11/2022 CLINICAL DATA:  Fall EXAM: LEFT KNEE - COMPLETE 4 VIEW COMPARISON:  None Available. FINDINGS: Moderate effusion. Prepatellar soft tissue swelling. No acute fracture, dislocation or subluxation. Osteophytes identified consistent with tricompartmental mild degenerative joint disease. IMPRESSION: Degenerative changes. No acute osseous abnormalities. Effusion. Soft tissue swelling. Electronically Signed   By: Layla Maw M.D.   On:  05/11/2022 08:33    Pertinent labs & imaging results that were available during my care of the patient were reviewed by me and considered in my medical decision making (see MDM for details).  Medications Ordered in ED Medications  acetaminophen (TYLENOL) tablet 1,000 mg (has no administration in time range)                                                                                                                                      Procedures Procedures  (including critical care time)  Medical Decision Making / ED Course   This patient presents to the ED for concern of fall, knee pain, this involves an extensive number of treatment options, and is a complaint that carries with it a high risk of complications and morbidity.  The differential diagnosis includes fracture, dislocation, contusion, ligamentous injury  MDM: Patient seen emergency room for evaluation of a fall with knee pain.  Physical exam reveals tenderness to the medial aspect of the left knee but patient does still have range of motion and there is no obvious deformity.  X-ray imaging with no acute traumatic injury, but does show a small effusion.  An Ace wrap was applied and Tylenol given and we attempted to ambulate the patient but he was unsteady on his feet with instability of the knee and physical therapy consult was placed.  Patient spent 7 hours in the emergency department and the physical therapy consult was never performed, but I reevaluated the patient and he was able to ambulate to the bathroom with use of a walker.  Patient uses a walker for ambulation at home at baseline and as patient is able to ambulate he is safe for discharge with outpatient follow-up.   Additional history obtained:  -External records from outside source obtained and reviewed including: Chart review including previous notes, labs, imaging, consultation notes   Imaging Studies ordered: I ordered imaging studies including x-ray knee I independently visualized and interpreted imaging. I agree with the radiologist interpretation   Medicines ordered and prescription drug management: Meds ordered this encounter  Medications   acetaminophen (TYLENOL) tablet 1,000 mg    -I have reviewed the patients home medicines and have made adjustments as needed  Critical interventions NONE  Consultations  Obtained: I requested consultation with the physical therapists but this consult was ultimately removed as the patient was able to ambulate after pain control   Cardiac Monitoring: The patient was maintained on a cardiac monitor.  I personally viewed and interpreted the cardiac monitored which showed an underlying rhythm of: nsr  Social Determinants of Health:  Factors impacting patients care include: NONE   Reevaluation: After the interventions noted above, I reevaluated the patient and found that they have :improved  Co morbidities that complicate the patient evaluation  Past Medical History:  Diagnosis Date   Cataract    Diabetes mellitus without complication ([facility]) 2018  Hypertension       Dispostion: I considered admission for this patient, but with negative imaging and ability to ambulate he is safe for discharge with outpatient follow-up     Final Clinical Impression(s) / ED Diagnoses Final diagnoses:  None     @PCDICTATION @    Teressa Lower, MD 05/11/22 1446

## 2022-05-11 NOTE — ED Triage Notes (Addendum)
Patient BIB [facility]. Fell 6 hours ago getting out of bed after losing his balance. Left knee pain but said his right knee also chronically hurts. Has neuropathy.

## 2022-05-11 NOTE — ED Notes (Addendum)
Ace wrap applied to patient's left knee. Patient instructed on how to use ace wrap.

## 2022-05-16 ENCOUNTER — Ambulatory Visit: Payer: Self-pay

## 2022-05-16 NOTE — Telephone Encounter (Signed)
  Chief Complaint: numbness to leg and foot that comes and goes  Symptoms: s/p fall, off balance, using W/C Frequency: 2 months Pertinent Negatives: Patient denies headache, dizziness, vision loss, double vision, changes in speech Disposition: [] ED /[x] Urgent Care (no appt availability in office) / [] Appointment(In office/virtual)/ []  [facility]/ [] Home Care/ [] Refused Recommended Disposition /[] [facility]/ []  Follow-up with PCP Additional Notes: pt advised to go to [facility] to check sugar and call back after to establish with a new PCP.   Reason for Disposition  [1] Numbness or tingling in one or both feet AND [2] is a chronic symptom (recurrent or ongoing AND present > 4 weeks)  Answer Assessment - Initial Assessment Questions 1. SYMPTOM: "What is the main symptom you are concerned about?" (e.g., weakness, numbness)     numbness 2. ONSET: "When did this start?" (minutes, hours, days; while sleeping)     2 months 3. LAST NORMAL: "When was the last time you (the patient) were normal (no symptoms)?"     Over year 4. PATTERN "Does this come and go, or has it been constant since it started?"  "Is it present now?"     Comes and goes 5. CARDIAC SYMPTOMS: "Have you had any of the following symptoms: chest pain, difficulty breathing, palpitations?"     no 6. NEUROLOGIC SYMPTOMS: "Have you had any of the following symptoms: headache, dizziness, vision loss, double vision, changes in speech, unsteady on your feet?"     Occasional headache 7. OTHER SYMPTOMS: "Do you have any other symptoms?"     Balance is off  8. PREGNANCY: "Is there any chance you are pregnant?" "When was your last menstrual period?"     N/a  Protocols used: Neurologic Deficit-A-AH

## 2022-05-17 ENCOUNTER — Emergency Department (HOSPITAL_COMMUNITY)
Admission: EM | Admit: 2022-05-17 | Discharge: 2022-05-17 | Disposition: A | Discharge status: Home / Self Care | Payer: Medicaid Other | Attending: Student | Admitting: Student

## 2022-05-17 ENCOUNTER — Inpatient Hospital Stay: Payer: Medicaid Other | Admitting: Internal Medicine

## 2022-05-17 DIAGNOSIS — M79604 Pain in right leg: Secondary | ICD-10-CM | POA: Diagnosis not present

## 2022-05-17 DIAGNOSIS — Z79899 Other long term (current) drug therapy: Secondary | ICD-10-CM | POA: Insufficient documentation

## 2022-05-17 DIAGNOSIS — E1140 Type 2 diabetes mellitus with diabetic neuropathy, unspecified: Secondary | ICD-10-CM | POA: Diagnosis not present

## 2022-05-17 DIAGNOSIS — E1165 Type 2 diabetes mellitus with hyperglycemia: Secondary | ICD-10-CM | POA: Diagnosis not present

## 2022-05-17 DIAGNOSIS — Z7984 Long term (current) use of oral hypoglycemic drugs: Secondary | ICD-10-CM | POA: Insufficient documentation

## 2022-05-17 DIAGNOSIS — M79641 Pain in right hand: Secondary | ICD-10-CM | POA: Diagnosis not present

## 2022-05-17 DIAGNOSIS — M2550 Pain in unspecified joint: Secondary | ICD-10-CM

## 2022-05-17 DIAGNOSIS — M79642 Pain in left hand: Secondary | ICD-10-CM | POA: Diagnosis not present

## 2022-05-17 DIAGNOSIS — I10 Essential (primary) hypertension: Secondary | ICD-10-CM | POA: Insufficient documentation

## 2022-05-17 DIAGNOSIS — M792 Neuralgia and neuritis, unspecified: Secondary | ICD-10-CM | POA: Diagnosis present

## 2022-05-17 DIAGNOSIS — G629 Polyneuropathy, unspecified: Secondary | ICD-10-CM

## 2022-05-17 DIAGNOSIS — R739 Hyperglycemia, unspecified: Secondary | ICD-10-CM

## 2022-05-17 LAB — CBG MONITORING, ED: Glucose-Capillary: 211 mg/dL — ABNORMAL HIGH (ref 70–99)

## 2022-05-17 MED ORDER — GABAPENTIN 100 MG PO CAPS
100.0000 mg | ORAL_CAPSULE | Freq: Once | ORAL | Status: AC
  Administered 2022-05-17: 100 mg via ORAL
  Filled 2022-05-17: qty 1

## 2022-05-17 MED ORDER — METFORMIN HCL 500 MG PO TABS
500.0000 mg | ORAL_TABLET | Freq: Once | ORAL | Status: AC
  Administered 2022-05-17: 500 mg via ORAL
  Filled 2022-05-17: qty 1

## 2022-05-17 MED ORDER — METFORMIN HCL 500 MG PO TABS: 500.0000 mg | ORAL_TABLET | Freq: Two times a day (BID) | ORAL | 0 refills | Status: DC

## 2022-05-17 MED ORDER — GABAPENTIN 100 MG PO CAPS: 100.0000 mg | ORAL_CAPSULE | Freq: Three times a day (TID) | ORAL | 0 refills | Status: DC

## 2022-05-17 NOTE — ED Triage Notes (Signed)
Pt states that earlier today he was to establish care with a new PCP and was ten minutes late, causing them to cancel his appointment. Pt was being seen for neuropathic pain secondary to T2DM. Pt states his ability to perform ADL's has been decreased. Pt recently noncompliant with medications.

## 2022-05-17 NOTE — Discharge Instructions (Addendum)
Please begin taking metformin twice daily with breakfast and dinner.  To help reduce diarrhea take a fiber supplement daily and/or increase fiber in your diet.  To help with neuropathy pain take gabapentin 100 mg 3 times daily.  If you have been taking this medication for a few days and are tolerating it well you can slowly increase the dose increasing to 200 mg 3 times daily.  You should discuss with your primary care doctor to help further adjust this dosing.  Given your underlying history of arthritis you may need further treatment of this to help with pain as well.  Can use Tylenol 650 mg every 6 hours for additional pain relief.  The most common side effects with gabapentin are sleepiness or dizziness.  If you are experiencing any side effects discussed with your primary care doctor.

## 2022-05-17 NOTE — ED Provider Notes (Signed)
[facility] DEPT Provider Note   CSN: [id number] Arrival date & time: 05/17/22  5456     History  Chief Complaint  Patient presents with   Neuropathic Pain    Gabriel Cook is a 52 y.o. male.  Gabriel Cook is a 52 y.o. male with a history of hypertension, diabetes and cataracts, who presents to the emergency department with concern for neuropathic pain.  Patient reports that he lost his insurance last year and has been without his Ozempic for his diabetes for at least 7 months.  He reports over the past several months he has been having increasing issues with joint pains as well as some paresthesias and numbness in his hands and feet and pain in particular in his right leg that is worse with movement.  He reports hypersensitivity over the right leg.  He is suspicious that this is likely diabetic neuropathy especially since he has not had his diabetes medications to control his blood sugars.  He also reports that at 1 point he was diagnosed with rheumatoid arthritis and has joint pain that may be contributing to his discomfort as well but has not been on any treatment for this.  Patient reports that he was supposed to have an appointment with a new primary care doctor today but was 10 minutes late for the appointment and told that he cannot be seen.  Patient reports that he was just concerned given the ongoing pain today and wanted to do something to get it addressed.  The history is provided by the patient.       Home Medications Prior to Admission medications   Medication Sig Start Date End Date Taking? Authorizing Provider  gabapentin (NEURONTIN) 100 MG capsule Take 1 capsule (100 mg total) by mouth 3 (three) times daily. 05/17/22  Yes Jacqlyn Larsen, PA-C  metFORMIN (GLUCOPHAGE) 500 MG tablet Take 1 tablet (500 mg total) by mouth 2 (two) times daily with a meal. 05/17/22  Yes Jacqlyn Larsen, PA-C  prednisoLONE acetate (PRED FORTE) 1 % ophthalmic  suspension PLEASE SEE ATTACHED FOR DETAILED DIRECTIONS 06/27/21   [provider]  rivaroxaban (XARELTO) 20 MG TABS tablet Take 1 tablet (20 mg total) by mouth daily with supper. 06/24/21   Mesner, Corene Cornea, MD  rosuvastatin (CRESTOR) 10 MG tablet Take 1 tablet (10 mg total) by mouth daily. 06/08/21 06/08/22  Tysinger, Camelia Eng, PA-C  Semaglutide, 1 MG/DOSE, (OZEMPIC, 1 MG/DOSE,) 4 MG/3ML SOPN Inject 1 mg into the skin once a week. 06/07/21   Tysinger, Camelia Eng, PA-C  tadalafil (CIALIS) 20 MG tablet Take 1 tablet (20 mg total) by mouth daily as needed for erectile dysfunction. 07/18/21   Tysinger, Camelia Eng, PA-C  terbinafine (LAMISIL AT) 1 % cream Apply 1 application topically 2 (two) times daily. 06/07/21   Tysinger, Camelia Eng, PA-C      Allergies    Patient has no known allergies.    Review of Systems   Review of Systems  Constitutional:  Negative for chills and fever.  HENT: Negative.    Respiratory:  Negative for shortness of breath.   Cardiovascular:  Negative for chest pain.  Gastrointestinal:  Negative for abdominal pain, nausea and vomiting.  Musculoskeletal:  Positive for arthralgias and myalgias.  Neurological:  Positive for numbness (Paresthesia).    Physical Exam Updated Vital Signs BP (!) 153/85   Pulse 77   Temp 97.8 F (36.6 C) (Oral)   Resp 18   SpO2 99%  Physical Exam  Vitals and nursing note reviewed.  Constitutional:      General: He is not in acute distress.    Appearance: Normal appearance. He is well-developed. He is not ill-appearing or diaphoretic.  HENT:     Head: Normocephalic and atraumatic.     Mouth/Throat:     Mouth: Mucous membranes are moist.     Pharynx: Oropharynx is clear.  Eyes:     General:        Right eye: No discharge.        Left eye: No discharge.  Cardiovascular:     Rate and Rhythm: Normal rate and regular rhythm.     Pulses: Normal pulses.     Heart sounds: Normal heart sounds.  Pulmonary:     Effort: Pulmonary effort is normal. No  respiratory distress.  Abdominal:     General: Bowel sounds are normal.     Palpations: Abdomen is soft.  Musculoskeletal:     Comments: No focal bony tenderness, all joints are supple and easily movable and all compartments are soft.  Neurological:     Mental Status: He is alert and oriented to person, place, and time.     Coordination: Coordination normal.     Comments: Speech is clear, able to follow commands CN III-XII intact Normal strength in upper and lower extremities bilaterally including dorsiflexion and plantar flexion, strong and equal grip strength Sensation normal to light and sharp touch Moves extremities without ataxia, coordination intact  Psychiatric:        Mood and Affect: Mood normal.        Behavior: Behavior normal.     ED Results / Procedures / Treatments   Labs (all labs ordered are listed, but only abnormal results are displayed) Labs Reviewed  CBG MONITORING, ED - Abnormal; Notable for the following components:      Result Value   Glucose-Capillary 211 (*)    All other components within normal limits    EKG None  Radiology No results found.  Procedures Procedures    Medications Ordered in ED Medications  gabapentin (NEURONTIN) capsule 100 mg (100 mg Oral Given 05/17/22 0957)  metFORMIN (GLUCOPHAGE) tablet 500 mg (500 mg Oral Given 05/17/22 0957)    ED Course/ Medical Decision Making/ A&P                             Medical Decision Making Risk Prescription drug management.   52 year old male presents to the emergency department reporting myalgias and arthralgias as well as some intermittent paresthesias.  He reports these have worsened over the past several months after he was no longer able to get his Ozempic to manage his diabetes due to loss of insurance.  He was supposed to reestablish care with a primary care doctor today but was late to the appointment and unable to be seen so he decided to come to the emergency department to have  this ongoing pain addressed.  On exam he has no neurologic deficits, 2+ pulses in all distal extremities and no focal bony tenderness.  Given that this has been a much more chronic issue I stressed with patient the importance of rescheduling his primary care appointment but in the meantime his blood sugar today is 211 he has no symptoms concerning for DKA or HHS but will restart him on metformin until he can follow-up with primary care and get restarted on Ozempic.  Will also start patient on 100 mg  of gabapentin 3 times daily for neuropathic pain which I do suspect may be contributing to his discomfort but also suspect given his joint pains that arthritis is also playing a role.  Stressed that patient will need to follow-up for titration of these medications and further evaluation of his complaints.  He has not had any trauma or injury and I do not feel that imaging or further lab work is warranted at this time.  At this time there does not appear to be any evidence of an acute emergency medical condition requiring further emergent evaluation and the patient appears stable for discharge with appropriate outpatient follow up. Diagnosis and return precautions discussed with patient who verbalizes understanding and is agreeable to discharge.           Final Clinical Impression(s) / ED Diagnoses Final diagnoses:  Hyperglycemia  Neuropathy  Arthralgia, unspecified joint    Rx / DC Orders ED Discharge Orders          Ordered    metFORMIN (GLUCOPHAGE) 500 MG tablet  2 times daily with meals        05/17/22 0942    gabapentin (NEURONTIN) 100 MG capsule  3 times daily        05/17/22 0942              Jacqlyn Larsen, PA-C 05/17/22 1343    Kommor, Debe Coder, MD 05/17/22 [phone]

## 2022-06-12 ENCOUNTER — Ambulatory Visit: Payer: Medicaid Other | Admitting: Family Medicine

## 2022-06-12 ENCOUNTER — Encounter: Payer: 59 | Admitting: Medical

## 2022-06-12 ENCOUNTER — Telehealth: Payer: Self-pay | Admitting: General Practice

## 2022-06-12 NOTE — Telephone Encounter (Signed)
error 

## 2022-06-28 ENCOUNTER — Ambulatory Visit (INDEPENDENT_AMBULATORY_CARE_PROVIDER_SITE_OTHER): Payer: Medicaid Other | Admitting: Family Medicine

## 2022-06-28 ENCOUNTER — Encounter: Payer: Self-pay | Admitting: Family Medicine

## 2022-06-28 VITALS — BP 122/74 | HR 75 | Temp 98.2°F | Ht 74.0 in

## 2022-06-28 DIAGNOSIS — I480 Paroxysmal atrial fibrillation: Secondary | ICD-10-CM | POA: Diagnosis not present

## 2022-06-28 DIAGNOSIS — R7303 Prediabetes: Secondary | ICD-10-CM

## 2022-06-28 DIAGNOSIS — Z720 Tobacco use: Secondary | ICD-10-CM

## 2022-06-28 DIAGNOSIS — M170 Bilateral primary osteoarthritis of knee: Secondary | ICD-10-CM | POA: Diagnosis not present

## 2022-06-28 DIAGNOSIS — Z125 Encounter for screening for malignant neoplasm of prostate: Secondary | ICD-10-CM

## 2022-06-28 DIAGNOSIS — Z6839 Body mass index (BMI) 39.0-39.9, adult: Secondary | ICD-10-CM | POA: Diagnosis not present

## 2022-06-28 DIAGNOSIS — Z789 Other specified health status: Secondary | ICD-10-CM | POA: Diagnosis not present

## 2022-06-28 DIAGNOSIS — E7800 Pure hypercholesterolemia, unspecified: Secondary | ICD-10-CM | POA: Diagnosis not present

## 2022-06-28 MED ORDER — OZEMPIC (1 MG/DOSE) 4 MG/3ML ~~LOC~~ SOPN: 1.0000 mg | PEN_INJECTOR | SUBCUTANEOUS | 5 refills | Status: DC

## 2022-06-28 MED ORDER — ROSUVASTATIN CALCIUM 10 MG PO TABS: 10.0000 mg | ORAL_TABLET | Freq: Every day | ORAL | 3 refills | Status: DC

## 2022-06-28 MED ORDER — METFORMIN HCL 500 MG PO TABS: 500.0000 mg | ORAL_TABLET | Freq: Two times a day (BID) | ORAL | 0 refills | Status: DC

## 2022-06-28 NOTE — Progress Notes (Addendum)
Established Patient Office Visit   Subjective:  Patient ID: Gabriel Cook, male    DOB: [birth year]  Age: 52 y.o. MRN: [id number]  Chief Complaint  Patient presents with   Establish Care    NP/establish care discuss history. Patient fasting.     HPI Encounter Diagnoses  Name Primary?   Screening for prostate cancer Yes   Prediabetes    BMI 39.0-39.9,adult    Alcohol use    Arthritis of both knees    Tobacco use    Paroxysmal atrial fibrillation (HCC)    Elevated cholesterol    For follow-up of above.  He has been out of medicines for some time now secondary to loss of insurance.  He had taken semaglutide with metformin in the past for prediabetes.  He was seen in the emergency room recently and they restarted the metformin.  He has successfully lost some weight as well with the semaglutide.  He has been told that he has arthritis in both of his knees.  He believes that it is rheumatoid arthritis but these are the only joints affected.  He had been using a cane and then a walker.  His left leg/knee went out on him and he decided to put himself in a wheelchair.  He is experiencing no weakness in his lower extremities.  History of PAF.  Had taken Xarelto in the past   Review of Systems  Constitutional: Negative.   HENT: Negative.    Eyes:  Negative for blurred vision, discharge and redness.  Respiratory: Negative.    Cardiovascular: Negative.   Gastrointestinal:  Negative for abdominal pain.  Genitourinary: Negative.   Musculoskeletal:  Positive for joint pain. Negative for myalgias.  Skin:  Negative for rash.  Neurological:  Positive for weakness. Negative for tingling and loss of consciousness.  Endo/Heme/Allergies:  Negative for polydipsia.     Current Outpatient Medications:    gabapentin (NEURONTIN) 100 MG capsule, Take 1 capsule (100 mg total) by mouth 3 (three) times daily. (Patient not taking: Reported on 06/28/2022), Disp: 90 capsule, Rfl: 0   metFORMIN (GLUCOPHAGE)  500 MG tablet, Take 1 tablet (500 mg total) by mouth 2 (two) times daily with a meal., Disp: 60 tablet, Rfl: 0   prednisoLONE acetate (PRED FORTE) 1 % ophthalmic suspension, PLEASE SEE ATTACHED FOR DETAILED DIRECTIONS (Patient not taking: Reported on 06/28/2022), Disp: , Rfl:    rivaroxaban (XARELTO) 20 MG TABS tablet, Take 1 tablet (20 mg total) by mouth daily with supper. (Patient not taking: Reported on 06/28/2022), Disp: 30 tablet, Rfl: 0   rosuvastatin (CRESTOR) 10 MG tablet, Take 1 tablet (10 mg total) by mouth daily., Disp: 90 tablet, Rfl: 3   Semaglutide, 1 MG/DOSE, (OZEMPIC, 1 MG/DOSE,) 4 MG/3ML SOPN, Inject 1 mg into the skin once a week., Disp: 3 mL, Rfl: 5   tadalafil (CIALIS) 20 MG tablet, Take 1 tablet (20 mg total) by mouth daily as needed for erectile dysfunction. (Patient not taking: Reported on 06/28/2022), Disp: 10 tablet, Rfl: 0   terbinafine (LAMISIL AT) 1 % cream, Apply 1 application topically 2 (two) times daily. (Patient not taking: Reported on 06/28/2022), Disp: 30 g, Rfl: 1   Objective:     BP 122/74 (BP Location: Left Arm, Patient Position: Sitting, Cuff Size: Large)   Pulse 75   Temp 98.2 F (36.8 C) (Temporal)   Ht 6\' 2"  (1.88 m)   SpO2 97%   BMI 39.80 kg/m  Wt Readings from Last 3 Encounters:  12/01/21 Marland Kitchen)  310 lb (140.6 kg)  06/30/21 (!) 319 lb (144.7 kg)  06/24/21 (!) 312 lb 3.2 oz (141.6 kg)      Physical Exam Constitutional:      General: He is not in acute distress.    Appearance: Normal appearance. He is not ill-appearing, toxic-appearing or diaphoretic.  HENT:     Head: Normocephalic and atraumatic.     Right Ear: External ear normal.     Left Ear: External ear normal.  Eyes:     General: No scleral icterus.       Right eye: No discharge.        Left eye: No discharge.     Extraocular Movements: Extraocular movements intact.     Conjunctiva/sclera: Conjunctivae normal.     Pupils: Pupils are equal, round, and reactive to light.  Cardiovascular:      Rate and Rhythm: Normal rate and regular rhythm.  Pulmonary:     Effort: Pulmonary effort is normal. No respiratory distress.     Breath sounds: Normal breath sounds.  Musculoskeletal:     Cervical back: No rigidity or tenderness.  Skin:    General: Skin is warm and dry.  Neurological:     Mental Status: He is alert and oriented to person, place, and time.     Motor: No weakness.  Psychiatric:        Mood and Affect: Mood normal.        Behavior: Behavior normal.      No results found for any visits on 06/28/22.    The 10-year ASCVD risk score (Arnett DK, et al., 2019) is: 14.7%    Assessment & Plan:   Screening for prostate cancer -     PSA  Prediabetes -     Comprehensive metabolic panel -     Hemoglobin A1c -     Urinalysis, Routine w reflex microscopic -     Microalbumin / creatinine urine ratio -     Ozempic (1 MG/DOSE); Inject 1 mg into the skin once a week.  Dispense: 3 mL; Refill: 5 -     metFORMIN HCl; Take 1 tablet (500 mg total) by mouth 2 (two) times daily with a meal.  Dispense: 60 tablet; Refill: 0  BMI 39.0-39.9,adult -     Ozempic (1 MG/DOSE); Inject 1 mg into the skin once a week.  Dispense: 3 mL; Refill: 5  Alcohol use -     CBC -     Comprehensive metabolic panel  Arthritis of both knees -     Rheumatoid factor -     Ambulatory referral to Orthopedic Surgery  Tobacco use  Paroxysmal atrial fibrillation (HCC) -     Ambulatory referral to Cardiology  Elevated cholesterol -     Comprehensive metabolic panel -     Lipid panel -     Rosuvastatin Calcium; Take 1 tablet (10 mg total) by mouth daily.  Dispense: 90 tablet; Refill: 3    Return in about 3 months (around 09/26/2022).  Will discuss alcohol and tobacco use at next visit.  Restart semaglutide and metformin.  Discussed cautions with semaglutide.  History of PAF.  Has taken Xarelto in the past.  No weakness noted in his lower extremities on exam today.  Encouraged him to use the  walker and a cane and then eventually ambulate without it..  May need to consider PMR.  Referral back to orthopedics for follow-up of arthritis in the knees.  Rechecking rheumatoid factor with his  given history of rheumatoid arthritis.   May need neurology follow-up.   Mliss Sax, MD

## 2022-06-29 LAB — COMPREHENSIVE METABOLIC PANEL
ALT: 16 U/L (ref 0–53)
AST: 13 U/L (ref 0–37)
Albumin: 4 g/dL (ref 3.5–5.2)
Alkaline Phosphatase: 79 U/L (ref 39–117)
BUN: 12 mg/dL (ref 6–23)
CO2: 27 mEq/L (ref 19–32)
Calcium: 9.9 mg/dL (ref 8.4–10.5)
Chloride: 105 mEq/L (ref 96–112)
Creatinine, Ser: 0.75 mg/dL (ref 0.40–1.50)
GFR: 104.23 mL/min (ref >60.00)
Glucose, Bld: 112 mg/dL — ABNORMAL HIGH (ref 70–99)
Potassium: 4.3 mEq/L (ref 3.5–5.1)
Sodium: 142 mEq/L (ref 135–145)
Total Bilirubin: 0.6 mg/dL (ref 0.2–1.2)
Total Protein: 7.2 g/dL (ref 6.0–8.3)

## 2022-06-29 LAB — CBC
HCT: 46.3 % (ref 39.0–52.0)
Hemoglobin: 14.9 g/dL (ref 13.0–17.0)
MCHC: 32.3 g/dL (ref 30.0–36.0)
MCV: 86.7 fl (ref 78.0–100.0)
Platelets: 168 10*3/uL (ref 150.0–400.0)
RBC: 5.34 Mil/uL (ref 4.22–5.81)
RDW: 13.1 % (ref 11.5–15.5)
WBC: 5.1 10*3/uL (ref 4.0–10.5)

## 2022-06-29 LAB — LIPID PANEL
Cholesterol: 198 mg/dL (ref 0–200)
HDL: 45.9 mg/dL (ref >39.00)
LDL Cholesterol: 129 mg/dL — ABNORMAL HIGH (ref 0–99)
NonHDL: 152.38
Total CHOL/HDL Ratio: 4
Triglycerides: 115 mg/dL (ref 0.0–149.0)
VLDL: 23 mg/dL (ref 0.0–40.0)

## 2022-06-29 LAB — PSA: PSA: 0.66 ng/mL (ref 0.10–4.00)

## 2022-06-29 LAB — HEMOGLOBIN A1C: Hgb A1c MFr Bld: 5.7 % (ref 4.6–6.5)

## 2022-06-29 LAB — RHEUMATOID FACTOR: Rheumatoid fact SerPl-aCnc: <14 IU/mL (ref <14)

## 2022-07-03 NOTE — Addendum Note (Signed)
Addended by: Adora Fridge on: 07/03/2022 03:34 PM   Modules accepted: Orders

## 2022-07-04 ENCOUNTER — Telehealth: Payer: Self-pay

## 2022-07-04 DIAGNOSIS — R7303 Prediabetes: Secondary | ICD-10-CM

## 2022-07-04 NOTE — Telephone Encounter (Signed)
Spoke with patient who verbalized understanding of labs and recommendations, per patient insurance would not cover Ozempic patient would like to know if something else could be sent in? Please advise

## 2022-07-05 ENCOUNTER — Other Ambulatory Visit: Payer: Self-pay | Admitting: Family Medicine

## 2022-07-05 DIAGNOSIS — M545 Low back pain, unspecified: Secondary | ICD-10-CM | POA: Diagnosis not present

## 2022-07-05 DIAGNOSIS — R7303 Prediabetes: Secondary | ICD-10-CM

## 2022-07-05 DIAGNOSIS — M25561 Pain in right knee: Secondary | ICD-10-CM | POA: Diagnosis not present

## 2022-07-05 DIAGNOSIS — M542 Cervicalgia: Secondary | ICD-10-CM | POA: Diagnosis not present

## 2022-07-05 DIAGNOSIS — M25562 Pain in left knee: Secondary | ICD-10-CM | POA: Diagnosis not present

## 2022-07-05 MED ORDER — TIRZEPATIDE 5 MG/0.5ML ~~LOC~~ SOAJ: 5.0000 mg | SUBCUTANEOUS | 0 refills | Status: DC

## 2022-07-05 NOTE — Telephone Encounter (Signed)
Patient notified VIA phone.  No further questions.  Dm/cma  

## 2022-07-16 NOTE — Progress Notes (Deleted)
Cardiology Office Note:   Date:  07/16/2022  NAME:  Gabriel Cook    MRN: [id number] DOB:  06-21-1970   PCP:  Mliss Sax, MD  Cardiologist:  None  Electrophysiologist:  None   Referring MD: Mliss Sax   No chief complaint on file. ***  History of Present Illness:   Gabriel Cook is a 52 y.o. male with a hx of HTN, obesity who is being seen today for the evaluation of pAF at the request of Mliss Sax, MD.  Problem List Paroxysmal Afib  -DCCV 06/24/2021 -CHADSVASC=1 (HTN) 2. HTN -A1c 5.7 -T chol 198, HDL 46, LDL 129, TG 115  Past Medical History: Past Medical History:  Diagnosis Date   Cataract    Diabetes mellitus without complication (HCC) 2018   Hypertension     Past Surgical History: Past Surgical History:  Procedure Laterality Date   CATARACT EXTRACTION      Current Medications: No outpatient medications have been marked as taking for the 07/20/22 encounter (Appointment) with Sande Rives, MD.     Allergies:    Patient has no known allergies.   Social History: Social History   Socioeconomic History   Marital status: Married    Spouse name: Not on file   Number of children: Not on file   Years of education: Not on file   Highest education level: Not on file  Occupational History   Not on file  Tobacco Use   Smoking status: Every Day    Packs/day: 0.25    Years: 30.00    Additional pack years: 0.00    Total pack years: 7.50    Types: Cigarettes   Smokeless tobacco: Never   Tobacco comments:    3 cigarettes daily 06/30/21  Vaping Use   Vaping Use: Never used  Substance and Sexual Activity   Alcohol use: Yes    Alcohol/week: 12.0 - 16.0 standard drinks of alcohol    Types: 12 - 16 Cans of beer per week    Comment: 4 beers in a sitting 3-4 times week 06/30/21   Drug use: Not Currently   Sexual activity: Yes  Other Topics Concern   Not on file  Social History Narrative   Lives alone . Walks  for exercise.  2 children.   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 There is no history of Heart disease or Stroke.  ROS:   All other ROS reviewed and negative. Pertinent positives noted in the HPI.     EKGs/Labs/Other Studies Reviewed:   The following studies were personally reviewed by me today:  EKG:  EKG is *** ordered today.  The ekg ordered today demonstrates ***, and was personally reviewed by me.   Recent Labs: 06/28/2022: ALT 16; BUN 12; Creatinine, Ser 0.75; Hemoglobin 14.9; Platelets 168.0; Potassium 4.3; Sodium 142   Recent Lipid Panel    Component Value Date/Time   CHOL 198 06/28/2022 1619   CHOL 232 (H) 06/07/2021 1154   TRIG 115.0 06/28/2022 1619   HDL 45.90 06/28/2022 1619   HDL 76 06/07/2021 1154   CHOLHDL 4  06/28/2022 1619   VLDL 23.0 06/28/2022 1619   LDLCALC 129 (H) 06/28/2022 1619   LDLCALC 140 (H) 06/07/2021 1154    Physical Exam:   VS:  There were no vitals taken for this visit.   Wt Readings from Last 3 Encounters:  12/01/21 (!) 310 lb (140.6 kg)  06/30/21 (!) 319 lb (144.7 kg)  06/24/21 (!) 312 lb 3.2 oz (141.6 kg)    General: Well nourished, well developed, in no acute distress Head: Atraumatic, normal size  Eyes: PEERLA, EOMI  Neck: Supple, no JVD Endocrine: No thryomegaly Cardiac: Normal S1, S2; RRR; no murmurs, rubs, or gallops Lungs: Clear to auscultation bilaterally, no wheezing, rhonchi or rales  Abd: Soft, nontender, no hepatomegaly  Ext: No edema, pulses 2+ Musculoskeletal: No deformities, BUE and BLE strength normal and equal Skin: Warm and dry, no rashes   Neuro: Alert  and oriented to person, place, time, and situation, CNII-XII grossly intact, no focal deficits  Psych: Normal mood and affect   ASSESSMENT:   Yvonne Stueber is a 52 y.o. male who presents for the following: No diagnosis found.  PLAN:   There are no diagnoses linked to this encounter.  {Are you ordering a CV Procedure (e.g. stress test, cath, DCCV, TEE, etc)?   Press F2        :[id number]}  Disposition: No follow-ups on file.  Medication Adjustments/Labs and Tests Ordered: Current medicines are reviewed at length with the patient today.  Concerns regarding medicines are outlined above.  No orders of the defined types were placed in this encounter.  No orders of the defined types were placed in this encounter.   There are no Patient Instructions on file for this visit.   Time Spent with Patient: I have spent a total of *** minutes with patient reviewing hospital notes, telemetry, EKGs, labs and examining the patient as well as establishing an assessment and plan that was discussed with the patient.  > 50% of time was spent in direct patient care.  Signed, Lenna Gilford. Flora Lipps, MD, [facility]  [facility]  [address], [address] [place], [place] [address] [phone]  07/16/2022 2:15 PM

## 2022-07-20 ENCOUNTER — Ambulatory Visit: Payer: Medicaid Other | Attending: Cardiovascular Disease | Admitting: Cardiovascular Disease

## 2022-07-20 DIAGNOSIS — I480 Paroxysmal atrial fibrillation: Secondary | ICD-10-CM

## 2022-07-20 DIAGNOSIS — I150 Renovascular hypertension: Secondary | ICD-10-CM

## 2022-07-29 IMAGING — DX DG CHEST 1V PORT
1 series · 1 of 1 positions shown · non-contrast
Comparison: Chest x-ray 07/29/2017.

CLINICAL DATA: 50-year-old male with history of chest pain and
shortness of breath.

EXAM:
PORTABLE CHEST 1 VIEW

[chest]
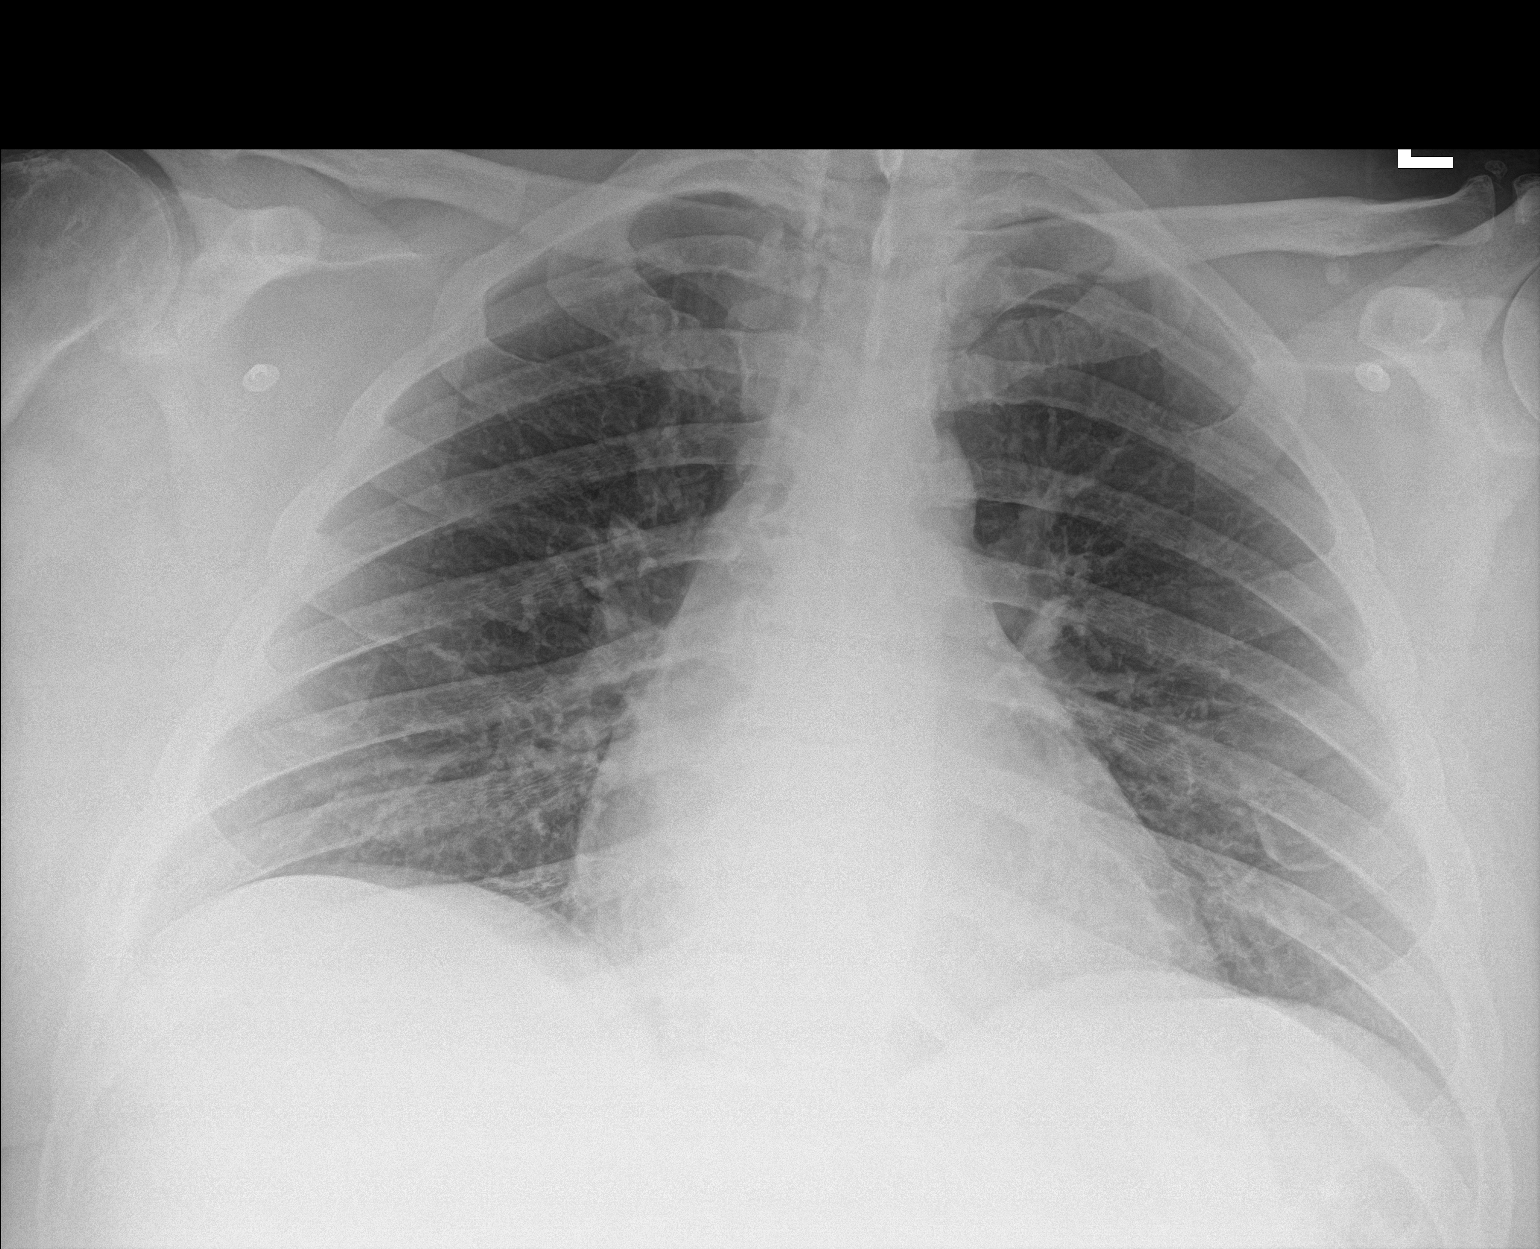

[1 of 1 positions shown; findings below may reference images not displayed]

FINDINGS: Lung volumes are normal. No consolidative airspace disease. No
pleural effusions. No pneumothorax. No pulmonary nodule or mass
noted. Pulmonary vasculature and the cardiomediastinal silhouette
are within normal limits.
IMPRESSION: No radiographic evidence of acute cardiopulmonary disease.

## 2022-08-24 ENCOUNTER — Ambulatory Visit: Payer: Medicaid Other | Admitting: Cardiovascular Disease

## 2022-08-26 NOTE — Progress Notes (Deleted)
Cardiology Office Note:   Date:  08/26/2022  NAME:  Gabriel Cook    MRN: [id number] DOB:  April 21, 1971   PCP:  Mliss Sax, MD  Cardiologist:  None  Electrophysiologist:  None   Referring MD: Mliss Sax   No chief complaint on file. ***  History of Present Illness:   Gabriel Cook is a 52 y.o. male with a hx of pAF, HTN, obesity who is being seen today for the evaluation of atrial fibrillation at the request of Mliss Sax, MD.  T chol 198, HDL 45, LDL 129, TG 115  Problem List Paroxysmal Atrial fibrillation  -DCCV 06/24/2021 -CHADSVASC=1 (HTN) 2. HTN 3. Obesity 4. Etoh abuse    Past Medical History: Past Medical History:  Diagnosis Date   Cataract    Diabetes mellitus without complication (HCC) 2018   Hypertension     Past Surgical History: Past Surgical History:  Procedure Laterality Date   CATARACT EXTRACTION      Current Medications: No outpatient medications have been marked as taking for the 08/27/22 encounter (Appointment) with Sande Rives, MD.     Allergies:    Patient has no known allergies.   Social History: Social History   Socioeconomic History   Marital status: Married    Spouse name: Not on file   Number of children: Not on file   Years of education: Not on file   Highest education level: Not on file  Occupational History   Not on file  Tobacco Use   Smoking status: Every Day    Packs/day: 0.25    Years: 30.00    Additional pack years: 0.00    Total pack years: 7.50    Types: Cigarettes   Smokeless tobacco: Never   Tobacco comments:    3 cigarettes daily 06/30/21  Vaping Use   Vaping Use: Never used  Substance and Sexual Activity   Alcohol use: Yes    Alcohol/week: 12.0 - 16.0 standard drinks of alcohol    Types: 12 - 16 Cans of beer per week    Comment: 4 beers in a sitting 3-4 times week 06/30/21   Drug use: Not Currently   Sexual activity: Yes  Other Topics Concern   Not on  file  Social History Narrative   Lives alone . Walks for exercise.  2 children.   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 There is no history of Heart disease or Stroke.  ROS:   All other ROS reviewed and negative. Pertinent positives noted in the HPI.     EKGs/Labs/Other Studies Reviewed:   The following studies were personally reviewed by me today:  EKG:  EKG is *** ordered today.  The ekg ordered today demonstrates ***, and was personally reviewed by me.   Recent Labs: 06/28/2022: ALT 16; BUN 12; Creatinine, Ser 0.75; Hemoglobin 14.9; Platelets 168.0; Potassium 4.3; Sodium 142   Recent Lipid Panel    Component Value Date/Time   CHOL 198 06/28/2022 1619   CHOL 232 (H) 06/07/2021 1154   TRIG 115.0 06/28/2022 1619   HDL 45.90 06/28/2022 1619  HDL 76 06/07/2021 1154   CHOLHDL 4 06/28/2022 1619   VLDL 23.0 06/28/2022 1619   LDLCALC 129 (H) 06/28/2022 1619   LDLCALC 140 (H) 06/07/2021 1154    Physical Exam:   VS:  There were no vitals taken for this visit.   Wt Readings from Last 3 Encounters:  12/01/21 (!) 310 lb (140.6 kg)  06/30/21 (!) 319 lb (144.7 kg)  06/24/21 (!) 312 lb 3.2 oz (141.6 kg)    General: Well nourished, well developed, in no acute distress Head: Atraumatic, normal size  Eyes: PEERLA, EOMI  Neck: Supple, no JVD Endocrine: No thryomegaly Cardiac: Normal S1, S2; RRR; no murmurs, rubs, or gallops Lungs: Clear to auscultation bilaterally, no wheezing, rhonchi or rales  Abd: Soft, nontender, no hepatomegaly  Ext: No edema, pulses 2+ Musculoskeletal: No deformities, BUE and BLE strength normal  and equal Skin: Warm and dry, no rashes   Neuro: Alert and oriented to person, place, time, and situation, CNII-XII grossly intact, no focal deficits  Psych: Normal mood and affect   ASSESSMENT:   Gabriel Cook is a 52 y.o. male who presents for the following: No diagnosis found.  PLAN:   There are no diagnoses linked to this encounter.  {Are you ordering a CV Procedure (e.g. stress test, cath, DCCV, TEE, etc)?   Press F2        :[id number]}  Disposition: No follow-ups on file.  Medication Adjustments/Labs and Tests Ordered: Current medicines are reviewed at length with the patient today.  Concerns regarding medicines are outlined above.  No orders of the defined types were placed in this encounter.  No orders of the defined types were placed in this encounter.   There are no Patient Instructions on file for this visit.   Time Spent with Patient: I have spent a total of *** minutes with patient reviewing hospital notes, telemetry, EKGs, labs and examining the patient as well as establishing an assessment and plan that was discussed with the patient.  > 50% of time was spent in direct patient care.  Signed, Lenna Gilford. Flora Lipps, MD, [facility]  [facility]  [address], [address] [place], [place] [address] [phone]  08/26/2022 9:22 PM

## 2022-08-27 ENCOUNTER — Ambulatory Visit: Payer: Medicaid Other | Attending: Cardiovascular Disease | Admitting: Cardiovascular Disease

## 2022-08-27 DIAGNOSIS — I480 Paroxysmal atrial fibrillation: Secondary | ICD-10-CM

## 2022-08-27 DIAGNOSIS — I10 Essential (primary) hypertension: Secondary | ICD-10-CM

## 2022-08-29 ENCOUNTER — Encounter: Payer: Self-pay | Admitting: Cardiovascular Disease

## 2022-09-06 DIAGNOSIS — M542 Cervicalgia: Secondary | ICD-10-CM | POA: Diagnosis not present

## 2022-10-10 DIAGNOSIS — M4802 Spinal stenosis, cervical region: Secondary | ICD-10-CM | POA: Diagnosis not present

## 2022-10-10 DIAGNOSIS — G959 Disease of spinal cord, unspecified: Secondary | ICD-10-CM | POA: Diagnosis not present

## 2022-10-10 DIAGNOSIS — Z6835 Body mass index (BMI) 35.0-35.9, adult: Secondary | ICD-10-CM | POA: Diagnosis not present

## 2022-10-12 ENCOUNTER — Other Ambulatory Visit: Payer: Self-pay | Admitting: Neurological Surgery

## 2022-10-23 ENCOUNTER — Other Ambulatory Visit: Payer: Self-pay | Admitting: Neurological Surgery

## 2022-10-23 NOTE — Pre-Procedure Instructions (Signed)
Surgical Instructions    Your procedure is scheduled on November 06, 2022.  Report to [facility] Main Entrance "A" at 9:00 A.M., then check in with the Admitting office.  Call this number if you have problems the morning of surgery:  [phone]  If you have any questions prior to your surgery date call [phone]: Open Monday-Friday 8am-4pm If you experience any cold or flu symptoms such as cough, fever, chills, shortness of breath, etc. between now and your scheduled surgery, please notify us at the above number.     Remember:  Do not eat or drink after midnight the night before your surgery     Take these medicines the morning of surgery with A SIP OF WATER: NONE   As of today, STOP taking any Aspirin (unless otherwise instructed by your surgeon) Aleve, Naproxen, Ibuprofen, Motrin, Advil, Goody's, BC's, all herbal medications, fish oil, and all vitamins.   WHAT DO I DO ABOUT MY DIABETES MEDICATION?   Do not take metFORMIN (GLUCOPHAGE) the morning of surgery.  STOP taking your tirzepatide [facility]) one week prior to surgery   HOW TO MANAGE YOUR DIABETES BEFORE AND AFTER SURGERY  Why is it important to control my blood sugar before and after surgery? Improving blood sugar levels before and after surgery helps healing and can limit problems. A way of improving blood sugar control is eating a healthy diet by:  Eating less sugar and carbohydrates  Increasing activity/exercise  Talking with your doctor about reaching your blood sugar goals High blood sugars (greater than 180 mg/dL) can raise your risk of infections and slow your recovery, so you will need to focus on controlling your diabetes during the weeks before surgery. Make sure that the doctor who takes care of your diabetes knows about your planned surgery including the date and location.  How do I manage my blood sugar before surgery? Check your blood sugar at least 4 times a day, starting 2 days before surgery, to  make sure that the level is not too high or low.  Check your blood sugar the morning of your surgery when you wake up and every 2 hours until you get to the Short Stay unit.  If your blood sugar is less than 70 mg/dL, you will need to treat for low blood sugar: Do not take insulin. Treat a low blood sugar (less than 70 mg/dL) with  cup of clear juice (cranberry or apple), 4 glucose tablets, OR glucose gel. Recheck blood sugar in 15 minutes after treatment (to make sure it is greater than 70 mg/dL). If your blood sugar is not greater than 70 mg/dL on recheck, call [phone] for further instructions. Report your blood sugar to the short stay nurse when you get to Short Stay.  If you are admitted to the hospital after surgery: Your blood sugar will be checked by the staff and you will probably be given insulin after surgery (instead of oral diabetes medicines) to make sure you have good blood sugar levels. The goal for blood sugar control after surgery is 80-180 mg/dL.                      Do NOT Smoke (Tobacco/Vaping) for 24 hours prior to your procedure.  If you use a CPAP at night, you may bring your mask/headgear for your overnight stay.   Contacts, glasses, piercing's, hearing aid's, dentures or partials may not be worn into surgery, please bring cases for these belongings.  For patients admitted to the hospital, discharge time will be determined by your treatment team.   Patients discharged the day of surgery will not be allowed to drive home, and someone needs to stay with them for 24 hours.  SURGICAL WAITING ROOM VISITATION Patients having surgery or a procedure may have no more than 2 support people in the waiting area - these visitors may rotate.   Children under the age of 41 must have an adult with them who is not the patient. If the patient needs to stay at the hospital during part of their recovery, the visitor guidelines for inpatient rooms apply. Pre-op nurse will  coordinate an appropriate time for 1 support person to accompany patient in pre-op.  This support person may not rotate.   Please refer to the [facility] website for the visitor guidelines for Inpatients (after your surgery is over and you are in a regular room).   If you received a COVID test during your pre-op visit  it is requested that you wear a mask when out in public, stay away from anyone that may not be feeling well and notify your surgeon if you develop symptoms. If you have been in contact with anyone that has tested positive in the last 10 days please notify you surgeon.     Pre-operative 5 CHG Bath Instructions   You can play a key role in reducing the risk of infection after surgery. Your skin needs to be as free of germs as possible. You can reduce the number of germs on your skin by washing with CHG (chlorhexidine gluconate) soap before surgery. CHG is an antiseptic soap that kills germs and continues to kill germs even after washing.   DO NOT use if you have an allergy to chlorhexidine/CHG or antibacterial soaps. If your skin becomes reddened or irritated, stop using the CHG and notify one of our RNs at [phone].   Please shower with the CHG soap starting 4 days before surgery using the following schedule:     Please keep in mind the following:  DO NOT shave, including legs and underarms, starting the day of your first shower.   You may shave your face at any point before/day of surgery.  Place clean sheets on your bed the day you start using CHG soap. Use a clean washcloth (not used since being washed) for each shower. DO NOT sleep with pets once you start using the CHG.   CHG Shower Instructions:  If you choose to wash your hair and private area, wash first with your normal shampoo/soap.  After you use shampoo/soap, rinse your hair and body thoroughly to remove shampoo/soap residue.  Turn the water OFF and apply about 3 tablespoons (45 ml) of CHG soap to a CLEAN  washcloth.  Apply CHG soap ONLY FROM YOUR NECK DOWN TO YOUR TOES (washing for 3-5 minutes)  DO NOT use CHG soap on face, private areas, open wounds, or sores.  Pay special attention to the area where your surgery is being performed.  If you are having back surgery, having someone wash your back for you may be helpful. Wait 2 minutes after CHG soap is applied, then you may rinse off the CHG soap.  Pat dry with a clean towel  Put on clean clothes/pajamas   If you choose to wear lotion, please use ONLY the CHG-compatible lotions on the back of this paper.     Additional instructions for the day of surgery: DO NOT APPLY any lotions,  deodorants, cologne, or perfumes.   Do not wear jewelry or makeup Do not wear nail polish, gel polish, artificial nails, or any other type of covering on natural nails (fingers and toes) Do not bring valuables to the hospital. [facility] is not responsible for any belongings or valuables. Put on clean/comfortable clothes.  Brush your teeth.  Ask your nurse before applying any prescription medications to the skin.      CHG Compatible Lotions   Aveeno Moisturizing lotion  Cetaphil Moisturizing Cream  Cetaphil Moisturizing Lotion  Clairol Herbal Essence Moisturizing Lotion, Dry Skin  Clairol Herbal Essence Moisturizing Lotion, Extra Dry Skin  Clairol Herbal Essence Moisturizing Lotion, Normal Skin  Curel Age Defying Therapeutic Moisturizing Lotion with Alpha Hydroxy  Curel Extreme Care Body Lotion  Curel Soothing Hands Moisturizing Hand Lotion  Curel Therapeutic Moisturizing Cream, Fragrance-Free  Curel Therapeutic Moisturizing Lotion, Fragrance-Free  Curel Therapeutic Moisturizing Lotion, Original Formula  Eucerin Daily Replenishing Lotion  Eucerin Dry Skin Therapy Plus Alpha Hydroxy Crme  Eucerin Dry Skin Therapy Plus Alpha Hydroxy Lotion  Eucerin Original Crme  Eucerin Original Lotion  Eucerin Plus Crme Eucerin Plus Lotion  Eucerin TriLipid  Replenishing Lotion  Keri Anti-Bacterial Hand Lotion  Keri Deep Conditioning Original Lotion Dry Skin Formula Softly Scented  Keri Deep Conditioning Original Lotion, Fragrance Free Sensitive Skin Formula  Keri Lotion Fast Absorbing Fragrance Free Sensitive Skin Formula  Keri Lotion Fast Absorbing Softly Scented Dry Skin Formula  Keri Original Lotion  Keri Skin Renewal Lotion Keri Silky Smooth Lotion  Keri Silky Smooth Sensitive Skin Lotion  Nivea Body Creamy Conditioning Oil  Nivea Body Extra Enriched Lotion  Nivea Body Original Lotion  Nivea Body Sheer Moisturizing Lotion Nivea Crme  Nivea Skin Firming Lotion  NutraDerm 30 Skin Lotion  NutraDerm Skin Lotion  NutraDerm Therapeutic Skin Cream  NutraDerm Therapeutic Skin Lotion  ProShield Protective Hand Cream  Provon moisturizing lotion    Please read over the following fact sheets that you were given.

## 2022-10-24 ENCOUNTER — Telehealth: Payer: Self-pay | Admitting: Family Medicine

## 2022-10-24 ENCOUNTER — Inpatient Hospital Stay (HOSPITAL_COMMUNITY)
Admission: RE | Admit: 2022-10-24 | Discharge: 2022-10-24 | Disposition: A | Discharge status: Home / Self Care | Payer: Medicaid Other | Source: Ambulatory Visit

## 2022-10-24 DIAGNOSIS — I480 Paroxysmal atrial fibrillation: Secondary | ICD-10-CM

## 2022-10-24 NOTE — Telephone Encounter (Signed)
Pt would like to know if he can be refer to a different cardiologist for for his heart

## 2022-10-26 ENCOUNTER — Telehealth: Payer: Self-pay

## 2022-10-26 NOTE — Telephone Encounter (Signed)
Lmtrc

## 2022-11-06 ENCOUNTER — Inpatient Hospital Stay (HOSPITAL_COMMUNITY): Admit: 2022-11-06 | Payer: No Typology Code available for payment source | Admitting: Neurological Surgery

## 2022-11-06 SURGERY — POSTERIOR CERVICAL FUSION/FORAMINOTOMY LEVEL 5
Anesthesia: General

## 2022-12-25 ENCOUNTER — Encounter: Payer: Self-pay | Admitting: Cardiology

## 2022-12-25 ENCOUNTER — Ambulatory Visit: Payer: Medicaid Other | Attending: Cardiology | Admitting: Cardiology

## 2022-12-25 VITALS — BP 130/64 | HR 88 | Ht 74.0 in | Wt 300.0 lb

## 2022-12-25 DIAGNOSIS — E782 Mixed hyperlipidemia: Secondary | ICD-10-CM

## 2022-12-25 DIAGNOSIS — Z01818 Encounter for other preprocedural examination: Secondary | ICD-10-CM | POA: Diagnosis not present

## 2022-12-25 DIAGNOSIS — I480 Paroxysmal atrial fibrillation: Secondary | ICD-10-CM | POA: Diagnosis not present

## 2022-12-25 DIAGNOSIS — R0609 Other forms of dyspnea: Secondary | ICD-10-CM

## 2022-12-25 MED ORDER — METOPROLOL TARTRATE 100 MG PO TABS: 100.0000 mg | ORAL_TABLET | Freq: Once | ORAL | 0 refills | Status: DC

## 2022-12-25 NOTE — Progress Notes (Signed)
Cardiology Office Note:    Date:  12/29/2022   ID:  Gabriel Cook, DOB 1971/04/06, MRN [id number]  PCP:  Mliss Sax, MD  Cardiologist:  Thomasene Ripple, DO  Electrophysiologist:  None   Referring MD: Mliss Sax,*   " I am not as active as I use to be"  History of Present Illness:    Gabriel Cook is a 52 y.o. male with a hx of paroxysmal atrial fibrillation, diabetes mellitus, hypertension here today to be evaluated preoperatively.  Accident.  This activity.  Did not really endorse if he is experiencing shortness of breath on exertion chest pain given his limited activity.  Past Medical History:  Diagnosis Date   Cataract    Diabetes mellitus without complication (HCC) 2018   Hypertension     Past Surgical History:  Procedure Laterality Date   CATARACT EXTRACTION      Current Medications: Current Meds  Medication Sig   gabapentin (NEURONTIN) 100 MG capsule Take 1 capsule (100 mg total) by mouth 3 (three) times daily.   metFORMIN (GLUCOPHAGE) 500 MG tablet Take 1 tablet (500 mg total) by mouth 2 (two) times daily with a meal.   metoprolol tartrate (LOPRESSOR) 100 MG tablet Take 1 tablet (100 mg total) by mouth once for 1 dose. PLEASE TAKE METOPROLOL 2  HOURS PRIOR TO CTA SCAN.   rivaroxaban (XARELTO) 20 MG TABS tablet Take 1 tablet (20 mg total) by mouth daily with supper.   rosuvastatin (CRESTOR) 10 MG tablet Take 1 tablet (10 mg total) by mouth daily.   tadalafil (CIALIS) 20 MG tablet Take 1 tablet (20 mg total) by mouth daily as needed for erectile dysfunction.   terbinafine (LAMISIL AT) 1 % cream Apply 1 application topically 2 (two) times daily.   tirzepatide [facility]) 5 MG/0.5ML Pen Inject 5 mg into the skin once a week.     Allergies:   Patient has no known allergies.   Social History   Socioeconomic History   Marital status: Married    Spouse name: Not on file   Number of children: Not on file   Years of education: Not on file    Highest education level: Not on file  Occupational History   Not on file  Tobacco Use   Smoking status: Every Day    Current packs/day: 0.25    Average packs/day: 0.3 packs/day for 30.0 years (7.5 ttl pk-yrs)    Types: Cigarettes   Smokeless tobacco: Never   Tobacco comments:    3 cigarettes daily 06/30/21  Vaping Use   Vaping status: Never Used  Substance and Sexual Activity   Alcohol use: Yes    Alcohol/week: 12.0 - 16.0 standard drinks of alcohol    Types: 12 - 16 Cans of beer per week    Comment: 4 beers in a sitting 3-4 times week 06/30/21   Drug use: Not Currently   Sexual activity: Yes  Other Topics Concern   Not on file  Social History Narrative   Lives alone . Walks for exercise.  2 children.   Works as Child psychotherapist.  05/2021   Social Determinants of Health   Financial Resource Strain: Not on file  Food Insecurity: Not on file  Transportation Needs: Not on file  Physical Activity: Not on file  Stress: Not on file  Social Connections: Not on file     Family History: The patient's family history includes Cancer in his mother; Diabetes in his father and mother; Hypertension in his  mother. There is no history of Heart disease or Stroke.  ROS:   Review of Systems  Constitution: Negative for decreased appetite, fever and weight gain.  HENT: Negative for congestion, ear discharge, hoarse voice and sore throat.   Eyes: Negative for discharge, redness, vision loss in right eye and visual halos.  Cardiovascular: Negative for chest pain, dyspnea on exertion, leg swelling, orthopnea and palpitations.  Respiratory: Negative for cough, hemoptysis, shortness of breath and snoring.   Endocrine: Negative for heat intolerance and polyphagia.  Hematologic/Lymphatic: Negative for bleeding problem. Does not bruise/bleed easily.  Skin: Negative for flushing, nail changes, rash and suspicious lesions.  Musculoskeletal: Negative for arthritis, joint pain, muscle cramps, myalgias,  neck pain and stiffness.  Gastrointestinal: Negative for abdominal pain, bowel incontinence, diarrhea and excessive appetite.  Genitourinary: Negative for decreased libido, genital sores and incomplete emptying.  Neurological: Negative for brief paralysis, focal weakness, headaches and loss of balance.  Psychiatric/Behavioral: Negative for altered mental status, depression and suicidal ideas.  Allergic/Immunologic: Negative for HIV exposure and persistent infections.    EKGs/Labs/Other Studies Reviewed:    The following studies were reviewed today:   EKG:  The ekg ordered today demonstrates normal sinus rhythm HR 88 bpm   Recent Labs: 06/28/2022: ALT 16; Hemoglobin 14.9; Platelets 168.0 12/25/2022: BUN 13; Creatinine, Ser 0.91; Potassium 4.1; Sodium 135  Recent Lipid Panel    Component Value Date/Time   CHOL 198 06/28/2022 1619   CHOL 232 (H) 06/07/2021 1154   TRIG 115.0 06/28/2022 1619   HDL 45.90 06/28/2022 1619   HDL 76 06/07/2021 1154   CHOLHDL 4 06/28/2022 1619   VLDL 23.0 06/28/2022 1619   LDLCALC 129 (H) 06/28/2022 1619   LDLCALC 140 (H) 06/07/2021 1154    Physical Exam:    VS:  BP 130/64   Pulse 88   Ht 6\' 2"  (1.88 m)   Wt 300 lb (136.1 kg)   SpO2 97%   BMI 38.52 kg/m     Wt Readings from Last 3 Encounters:  12/25/22 300 lb (136.1 kg)  12/01/21 (!) 310 lb (140.6 kg)  06/30/21 (!) 319 lb (144.7 kg)     GEN: Well nourished, well developed in no acute distress HEENT: Normal NECK: No JVD; No carotid bruits LYMPHATICS: No lymphadenopathy CARDIAC: S1S2 noted,RRR, no murmurs, rubs, gallops RESPIRATORY:  Clear to auscultation without rales, wheezing or rhonchi  ABDOMEN: Soft, non-tender, non-distended, +bowel sounds, no guarding. EXTREMITIES: No edema, No cyanosis, no clubbing MUSCULOSKELETAL:  No deformity  SKIN: Warm and dry NEUROLOGIC:  Alert and oriented x 3, non-focal PSYCHIATRIC:  Normal affect, good insight  ASSESSMENT:    1. Paroxysmal atrial  fibrillation (HCC)   2. Dyspnea on exertion   3. Preoperative clearance   4. Mixed hyperlipidemia    PLAN:    The symptoms chest pain is concerning, this patient does have intermediate risk for coronary artery disease and at this time I would like to pursue an ischemic evaluation in this patient.  Shared decision a coronary CTA at this time is appropriate.  I have discussed with the patient about the testing.  The patient has no IV contrast allergy and is agreeable to proceed with this test.  Blood pressure is at target, no medication change at this time   Continue her current dose of crestor.  DM is managed by is pcp.   The patient is in agreement with the above plan. The patient left the office in stable condition.  The patient will follow  up in   Medication Adjustments/Labs and Tests Ordered: Current medicines are reviewed at length with the patient today.  Concerns regarding medicines are outlined above.  Orders Placed This Encounter  Procedures   CT CORONARY MORPH W/CTA COR W/SCORE W/CA W/CM &/OR WO/CM   Basic metabolic panel   EKG 12-Lead   Meds ordered this encounter  Medications   metoprolol tartrate (LOPRESSOR) 100 MG tablet    Sig: Take 1 tablet (100 mg total) by mouth once for 1 dose. PLEASE TAKE METOPROLOL 2  HOURS PRIOR TO CTA SCAN.    Dispense:  1 tablet    Refill:  0    Patient Instructions  Medication Instructions:  Metoprolol Tartrate 100 mg 2 hours before CTA *If you need a refill on your cardiac medications before your next appointment, please call your pharmacy*   Lab Work: BMP If you have labs (blood work) drawn today and your tests are completely normal, you will receive your results only by: MyChart Message (if you have MyChart) OR A paper copy in the mail If you have any lab test that is abnormal or we need to change your treatment, we will call you to review the results.   Testing/Procedures:   Your cardiac CT will be scheduled at one of the  below locations:   [facility] [address] [place], [place] [address] [phone]   If scheduled at [facility], please arrive at the [facility] and Children's Entrance (Entrance C2) of [facility] 30 minutes prior to test start time. You can use the FREE valet parking offered at entrance C (encouraged to control the heart rate for the test)  Proceed to the [facility] Radiology Department (first floor) to check-in and test prep.  All radiology patients and guests should use entrance C2 at [facility], accessed from [facility], even though the hospital's physical address listed is [address]     Please follow these instructions carefully (unless otherwise directed):  An IV will be required for this test and Nitroglycerin will be given.  Hold all erectile dysfunction medications at least 3 days (72 hrs) prior to test. (Ie viagra, cialis, sildenafil, tadalafil, etc)   On the Night Before the Test: Be sure to Drink plenty of water. Do not consume any caffeinated/decaffeinated beverages or chocolate 12 hours prior to your test. Do not take any antihistamines 12 hours prior to your test.   On the Day of the Test: Drink plenty of water until 1 hour prior to the test. Do not eat any food 1 hour prior to test. You may take your regular medications prior to the test.  Take metoprolol (Lopressor) two hours prior to test.  After the Test: Drink plenty of water. After receiving IV contrast, you may experience a mild flushed feeling. This is normal. On occasion, you may experience a mild rash up to 24 hours after the test. This is not dangerous. If this occurs, you can take Benadryl 25 mg and increase your fluid intake. If you experience trouble breathing, this can be serious. If it is severe call 911 IMMEDIATELY. If it is mild, please call our office. If you take any of these medications: Glipizide/Metformin, Avandament,  Glucavance, please do not take 48 hours after completing test unless otherwise instructed.  We will call to schedule your test 2-4 weeks out understanding that some insurance companies will need an authorization prior to the service being performed.   For more  information and frequently asked questions, please visit our website : [email or web address]  For non-scheduling related questions, please contact the cardiac imaging nurse navigator should you have any questions/concerns: Cardiac Imaging Nurse Navigators Direct Office Dial: [phone]   For scheduling needs, including cancellations and rescheduling, please call [place], [phone].    Follow-Up: At [facility], you and your health needs are our priority.  As part of our continuing mission to provide you with exceptional heart care, we have created designated Provider Care Teams.  These Care Teams include your primary Cardiologist (physician) and Advanced Practice Providers (APPs -  Physician Assistants and Nurse Practitioners) who all work together to provide you with the care you need, when you need it.  We recommend signing up for the patient portal called "MyChart".  Sign up information is provided on this After Visit Summary.  MyChart is used to connect with patients for Virtual Visits (Telemedicine).  Patients are able to view lab/test results, encounter notes, upcoming appointments, etc.  Non-urgent messages can be sent to your provider as well.   To learn more about what you can do with MyChart, go to [email or web address].    Your next appointment:   12 week(s)  Provider:   Dr Servando Salina    Adopting a Healthy Lifestyle.  Know what a healthy weight is for you (roughly BMI <25) and aim to maintain this   Aim for 7+ servings of fruits and vegetables daily   65-80+ fluid ounces of water or unsweet tea for healthy kidneys   Limit to max 1 drink of alcohol per day; avoid smoking/tobacco   Limit  animal fats in diet for cholesterol and heart health - choose grass fed whenever available   Avoid highly processed foods, and foods high in saturated/trans fats   Aim for low stress - take time to unwind and care for your mental health   Aim for 150 min of moderate intensity exercise weekly for heart health, and weights twice weekly for bone health   Aim for 7-9 hours of sleep daily   When it comes to diets, agreement about the perfect plan isnt easy to find, even among the experts. Experts at the [facility] [employer] developed an idea known as the Healthy Eating Plate. Just imagine a plate divided into logical, healthy portions.   The emphasis is on diet quality:   Load up on vegetables and fruits - one-half of your plate: Aim for color and variety, and remember that potatoes dont count.   Go for whole grains - one-quarter of your plate: Whole wheat, barley, wheat berries, quinoa, oats, brown rice, and foods made with them. If you want pasta, go with whole wheat pasta.   Protein power - one-quarter of your plate: Fish, chicken, beans, and nuts are all healthy, versatile protein sources. Limit red meat.   The diet, however, does go beyond the plate, offering a few other suggestions.   Use healthy plant oils, such as olive, canola, soy, corn, sunflower and peanut. Check the labels, and avoid partially hydrogenated oil, which have unhealthy trans fats.   If youre thirsty, drink water. Coffee and tea are good in moderation, but skip sugary drinks and limit milk and dairy products to one or two daily servings.   The type of carbohydrate in the diet is more important than the amount. Some sources of carbohydrates, such as vegetables, fruits, whole grains, and beans-are healthier than others.   Finally, stay active  Signed, Amber Guthridge, DO  12/29/2022 12:11 AM    [facility]

## 2022-12-25 NOTE — Patient Instructions (Signed)
Medication Instructions:  Metoprolol Tartrate 100 mg 2 hours before CTA *If you need a refill on your cardiac medications before your next appointment, please call your pharmacy*   Lab Work: BMP If you have labs (blood work) drawn today and your tests are completely normal, you will receive your results only by: MyChart Message (if you have MyChart) OR A paper copy in the mail If you have any lab test that is abnormal or we need to change your treatment, we will call you to review the results.   Testing/Procedures:   Your cardiac CT will be scheduled at one of the below locations:   [facility] [address] [place], [place] [address] [phone]   If scheduled at [facility], please arrive at the [facility] and Children's Entrance (Entrance C2) of [facility] 30 minutes prior to test start time. You can use the FREE valet parking offered at entrance C (encouraged to control the heart rate for the test)  Proceed to the [facility] Radiology Department (first floor) to check-in and test prep.  All radiology patients and guests should use entrance C2 at [facility], accessed from [facility], even though the hospital's physical address listed is [address].     Please follow these instructions carefully (unless otherwise directed):  An IV will be required for this test and Nitroglycerin will be given.  Hold all erectile dysfunction medications at least 3 days (72 hrs) prior to test. (Ie viagra, cialis, sildenafil, tadalafil, etc)   On the Night Before the Test: Be sure to Drink plenty of water. Do not consume any caffeinated/decaffeinated beverages or chocolate 12 hours prior to your test. Do not take any antihistamines 12 hours prior to your test.   On the Day of the Test: Drink plenty of water until 1 hour prior to the test. Do not eat any food 1 hour prior to test. You may take your regular medications prior  to the test.  Take metoprolol (Lopressor) two hours prior to test.  After the Test: Drink plenty of water. After receiving IV contrast, you may experience a mild flushed feeling. This is normal. On occasion, you may experience a mild rash up to 24 hours after the test. This is not dangerous. If this occurs, you can take Benadryl 25 mg and increase your fluid intake. If you experience trouble breathing, this can be serious. If it is severe call 911 IMMEDIATELY. If it is mild, please call our office. If you take any of these medications: Glipizide/Metformin, Avandament, Glucavance, please do not take 48 hours after completing test unless otherwise instructed.  We will call to schedule your test 2-4 weeks out understanding that some insurance companies will need an authorization prior to the service being performed.   For more information and frequently asked questions, please visit our website : [email or web address]  For non-scheduling related questions, please contact the cardiac imaging nurse navigator should you have any questions/concerns: Cardiac Imaging Nurse Navigators Direct Office Dial: [phone]   For scheduling needs, including cancellations and rescheduling, please call [place], [phone].    Follow-Up: At [facility], you and your health needs are our priority.  As part of our continuing mission to provide you with exceptional heart care, we have created designated Provider Care Teams.  These Care Teams include your primary Cardiologist (physician) and Advanced Practice Providers (APPs -  Physician Assistants and Nurse Practitioners) who all work together to provide you with the  care you need, when you need it.  We recommend signing up for the patient portal called "MyChart".  Sign up information is provided on this After Visit Summary.  MyChart is used to connect with patients for Virtual Visits (Telemedicine).  Patients are able to view lab/test  results, encounter notes, upcoming appointments, etc.  Non-urgent messages can be sent to your provider as well.   To learn more about what you can do with MyChart, go to [email or web address].    Your next appointment:   12 week(s)  Provider:   Dr Servando Salina

## 2022-12-26 LAB — BASIC METABOLIC PANEL
BUN/Creatinine Ratio: 14 (ref 9–20)
BUN: 13 mg/dL (ref 6–24)
CO2: 21 mmol/L (ref 20–29)
Calcium: 9.3 mg/dL (ref 8.7–10.2)
Chloride: 101 mmol/L (ref 96–106)
Creatinine, Ser: 0.91 mg/dL (ref 0.76–1.27)
Glucose: 249 mg/dL — ABNORMAL HIGH (ref 70–99)
Potassium: 4.1 mmol/L (ref 3.5–5.2)
Sodium: 135 mmol/L (ref 134–144)
eGFR: 101 mL/min/{1.73_m2} (ref >59)

## 2022-12-28 ENCOUNTER — Encounter (HOSPITAL_COMMUNITY): Payer: Self-pay

## 2022-12-28 ENCOUNTER — Other Ambulatory Visit: Payer: Self-pay | Admitting: Medical

## 2022-12-29 DIAGNOSIS — Z01818 Encounter for other preprocedural examination: Secondary | ICD-10-CM | POA: Insufficient documentation

## 2022-12-29 DIAGNOSIS — R0609 Other forms of dyspnea: Secondary | ICD-10-CM | POA: Insufficient documentation

## 2022-12-29 DIAGNOSIS — E782 Mixed hyperlipidemia: Secondary | ICD-10-CM | POA: Insufficient documentation

## 2022-12-29 NOTE — Progress Notes (Incomplete)
Cardiology Office Note:    Date:  12/29/2022   ID:  Gabriel Cook, DOB 1970/05/09, MRN [id number]  PCP:  Mliss Sax, MD  Cardiologist:  Thomasene Ripple, DO  Electrophysiologist:  None   Referring MD: Mliss Sax,*   " I am not as active as I use to be"  History of Present Illness:    Gabriel Cook is a 52 y.o. male with a hx of   Past Medical History:  Diagnosis Date  . Cataract   . Diabetes mellitus without complication (HCC) 2018  . Hypertension     Past Surgical History:  Procedure Laterality Date  . CATARACT EXTRACTION      Current Medications: Current Meds  Medication Sig  . gabapentin (NEURONTIN) 100 MG capsule Take 1 capsule (100 mg total) by mouth 3 (three) times daily.  . metFORMIN (GLUCOPHAGE) 500 MG tablet Take 1 tablet (500 mg total) by mouth 2 (two) times daily with a meal.  . metoprolol tartrate (LOPRESSOR) 100 MG tablet Take 1 tablet (100 mg total) by mouth once for 1 dose. PLEASE TAKE METOPROLOL 2  HOURS PRIOR TO CTA SCAN.  . rivaroxaban (XARELTO) 20 MG TABS tablet Take 1 tablet (20 mg total) by mouth daily with supper.  . rosuvastatin (CRESTOR) 10 MG tablet Take 1 tablet (10 mg total) by mouth daily.  . tadalafil (CIALIS) 20 MG tablet Take 1 tablet (20 mg total) by mouth daily as needed for erectile dysfunction.  . terbinafine (LAMISIL AT) 1 % cream Apply 1 application topically 2 (two) times daily.  . tirzepatide (MOUNJARO) 5 MG/0.5ML Pen Inject 5 mg into the skin once a week.     Allergies:   Patient has no known allergies.   Social History   Socioeconomic History  . Marital status: Married    Spouse name: Not on file  . Number of children: Not on file  . Years of education: Not on file  . Highest education level: Not on file  Occupational History  . Not on file  Tobacco Use  . Smoking status: Every Day    Current packs/day: 0.25    Average packs/day: 0.3 packs/day for 30.0 years (7.5 ttl pk-yrs)    Types: Cigarettes   . Smokeless tobacco: Never  . Tobacco comments:    3 cigarettes daily 06/30/21  Vaping Use  . Vaping status: Never Used  Substance and Sexual Activity  . Alcohol use: Yes    Alcohol/week: 12.0 - 16.0 standard drinks of alcohol    Types: 12 - 16 Cans of beer per week    Comment: 4 beers in a sitting 3-4 times week 06/30/21  . Drug use: Not Currently  . Sexual activity: Yes  Other Topics Concern  . Not on file  Social History Narrative   Lives alone . Walks for exercise.  2 children.   Works as Child psychotherapist.  05/2021   Social Determinants of Health   Financial Resource Strain: Not on file  Food Insecurity: Not on file  Transportation Needs: Not on file  Physical Activity: Not on file  Stress: Not on file  Social Connections: Not on file     Family History: The patient's family history includes Cancer in his mother; Diabetes in his father and mother; Hypertension in his mother. There is no history of Heart disease or Stroke.  ROS:   Review of Systems  Constitution: Negative for decreased appetite, fever and weight gain.  HENT: Negative for congestion, ear discharge, hoarse voice  and sore throat.   Eyes: Negative for discharge, redness, vision loss in right eye and visual halos.  Cardiovascular: Negative for chest pain, dyspnea on exertion, leg swelling, orthopnea and palpitations.  Respiratory: Negative for cough, hemoptysis, shortness of breath and snoring.   Endocrine: Negative for heat intolerance and polyphagia.  Hematologic/Lymphatic: Negative for bleeding problem. Does not bruise/bleed easily.  Skin: Negative for flushing, nail changes, rash and suspicious lesions.  Musculoskeletal: Negative for arthritis, joint pain, muscle cramps, myalgias, neck pain and stiffness.  Gastrointestinal: Negative for abdominal pain, bowel incontinence, diarrhea and excessive appetite.  Genitourinary: Negative for decreased libido, genital sores and incomplete emptying.  Neurological:  Negative for brief paralysis, focal weakness, headaches and loss of balance.  Psychiatric/Behavioral: Negative for altered mental status, depression and suicidal ideas.  Allergic/Immunologic: Negative for HIV exposure and persistent infections.    EKGs/Labs/Other Studies Reviewed:    The following studies were reviewed today:   EKG:  The ekg ordered today demonstrates   Recent Labs: 06/28/2022: ALT 16; Hemoglobin 14.9; Platelets 168.0 12/25/2022: BUN 13; Creatinine, Ser 0.91; Potassium 4.1; Sodium 135  Recent Lipid Panel    Component Value Date/Time   CHOL 198 06/28/2022 1619   CHOL 232 (H) 06/07/2021 1154   TRIG 115.0 06/28/2022 1619   HDL 45.90 06/28/2022 1619   HDL 76 06/07/2021 1154   CHOLHDL 4 06/28/2022 1619   VLDL 23.0 06/28/2022 1619   LDLCALC 129 (H) 06/28/2022 1619   LDLCALC 140 (H) 06/07/2021 1154    Physical Exam:    VS:  BP 130/64   Pulse 88   Ht 6\' 2"  (1.88 m)   Wt 300 lb (136.1 kg)   SpO2 97%   BMI 38.52 kg/m     Wt Readings from Last 3 Encounters:  12/25/22 300 lb (136.1 kg)  12/01/21 (!) 310 lb (140.6 kg)  06/30/21 (!) 319 lb (144.7 kg)     GEN: Well nourished, well developed in no acute distress HEENT: Normal NECK: No JVD; No carotid bruits LYMPHATICS: No lymphadenopathy CARDIAC: S1S2 noted,RRR, no murmurs, rubs, gallops RESPIRATORY:  Clear to auscultation without rales, wheezing or rhonchi  ABDOMEN: Soft, non-tender, non-distended, +bowel sounds, no guarding. EXTREMITIES: No edema, No cyanosis, no clubbing MUSCULOSKELETAL:  No deformity  SKIN: Warm and dry NEUROLOGIC:  Alert and oriented x 3, non-focal PSYCHIATRIC:  Normal affect, good insight  ASSESSMENT:    1. Paroxysmal atrial fibrillation (HCC)   2. Dyspnea on exertion   3. Preoperative clearance    PLAN:     1.  The patient is in agreement with the above plan. The patient left the office in stable condition.  The patient will follow up in   Medication Adjustments/Labs and  Tests Ordered: Current medicines are reviewed at length with the patient today.  Concerns regarding medicines are outlined above.  Orders Placed This Encounter  Procedures  . CT CORONARY MORPH W/CTA COR W/SCORE W/CA W/CM &/OR WO/CM  . Basic metabolic panel  . EKG 12-Lead   Meds ordered this encounter  Medications  . metoprolol tartrate (LOPRESSOR) 100 MG tablet    Sig: Take 1 tablet (100 mg total) by mouth once for 1 dose. PLEASE TAKE METOPROLOL 2  HOURS PRIOR TO CTA SCAN.    Dispense:  1 tablet    Refill:  0    Patient Instructions  Medication Instructions:  Metoprolol Tartrate 100 mg 2 hours before CTA *If you need a refill on your cardiac medications before your next appointment, please call  your pharmacy*   Lab Work: [facility] If you have labs (blood work) drawn today and your tests are completely normal, you will receive your results only by: MyChart Message (if you have MyChart) OR A paper copy in the mail If you have any lab test that is abnormal or we need to change your treatment, we will call you to review the results.   Testing/Procedures:   Your cardiac CT will be scheduled at one of the below locations:   [facility] [address] [place], [place] [address] [phone]   If scheduled at [facility], please arrive at the [facility] and Children's Entrance (Entrance C2) of [facility] 30 minutes prior to test start time. You can use the FREE valet parking offered at entrance C (encouraged to control the heart rate for the test)  Proceed to the [facility] Radiology Department (first floor) to check-in and test prep.  All radiology patients and guests should use entrance C2 at [facility], accessed from [facility], even though the hospital's physical address listed is [address]     Please follow these instructions carefully (unless otherwise directed):  An IV will be required for this test and  Nitroglycerin will be given.  Hold all erectile dysfunction medications at least 3 days (72 hrs) prior to test. (Ie viagra, cialis, sildenafil, tadalafil, etc)   On the Night Before the Test: Be sure to Drink plenty of water. Do not consume any caffeinated/decaffeinated beverages or chocolate 12 hours prior to your test. Do not take any antihistamines 12 hours prior to your test.   On the Day of the Test: Drink plenty of water until 1 hour prior to the test. Do not eat any food 1 hour prior to test. You may take your regular medications prior to the test.  Take metoprolol (Lopressor) two hours prior to test.  After the Test: Drink plenty of water. After receiving IV contrast, you may experience a mild flushed feeling. This is normal. On occasion, you may experience a mild rash up to 24 hours after the test. This is not dangerous. If this occurs, you can take Benadryl 25 mg and increase your fluid intake. If you experience trouble breathing, this can be serious. If it is severe call 911 IMMEDIATELY. If it is mild, please call our office. If you take any of these medications: Glipizide/Metformin, Avandament, Glucavance, please do not take 48 hours after completing test unless otherwise instructed.  We will call to schedule your test 2-4 weeks out understanding that some insurance companies will need an authorization prior to the service being performed.   For more information and frequently asked questions, please visit our website : [email or web address]  For non-scheduling related questions, please contact the cardiac imaging nurse navigator should you have any questions/concerns: Cardiac Imaging Nurse Navigators Direct Office Dial: [phone]   For scheduling needs, including cancellations and rescheduling, please call [place], [phone].    Follow-Up: At [facility], you and your health needs are our priority.  As part of our continuing mission to  provide you with exceptional heart care, we have created designated Provider Care Teams.  These Care Teams include your primary Cardiologist (physician) and Advanced Practice Providers (APPs -  Physician Assistants and Nurse Practitioners) who all work together to provide you with the care you need, when you need it.  We recommend signing up for the patient portal called "MyChart".  Sign up information is provided on  this After Visit Summary.  MyChart is used to connect with patients for Virtual Visits (Telemedicine).  Patients are able to view lab/test results, encounter notes, upcoming appointments, etc.  Non-urgent messages can be sent to your provider as well.   To learn more about what you can do with MyChart, go to [email or web address].    Your next appointment:   12 week(s)  Provider:   Dr Servando Salina    Adopting a Healthy Lifestyle.  Know what a healthy weight is for you (roughly BMI <25) and aim to maintain this   Aim for 7+ servings of fruits and vegetables daily   65-80+ fluid ounces of water or unsweet tea for healthy kidneys   Limit to max 1 drink of alcohol per day; avoid smoking/tobacco   Limit animal fats in diet for cholesterol and heart health - choose grass fed whenever available   Avoid highly processed foods, and foods high in saturated/trans fats   Aim for low stress - take time to unwind and care for your mental health   Aim for 150 min of moderate intensity exercise weekly for heart health, and weights twice weekly for bone health   Aim for 7-9 hours of sleep daily   When it comes to diets, agreement about the perfect plan isnt easy to find, even among the experts. Experts at the [facility] [employer] developed an idea known as the Healthy Eating Plate. Just imagine a plate divided into logical, healthy portions.   The emphasis is on diet quality:   Load up on vegetables and fruits - one-half of your plate: Aim for color and variety, and remember  that potatoes dont count.   Go for whole grains - one-quarter of your plate: Whole wheat, barley, wheat berries, quinoa, oats, brown rice, and foods made with them. If you want pasta, go with whole wheat pasta.   Protein power - one-quarter of your plate: Fish, chicken, beans, and nuts are all healthy, versatile protein sources. Limit red meat.   The diet, however, does go beyond the plate, offering a few other suggestions.   Use healthy plant oils, such as olive, canola, soy, corn, sunflower and peanut. Check the labels, and avoid partially hydrogenated oil, which have unhealthy trans fats.   If youre thirsty, drink water. Coffee and tea are good in moderation, but skip sugary drinks and limit milk and dairy products to one or two daily servings.   The type of carbohydrate in the diet is more important than the amount. Some sources of carbohydrates, such as vegetables, fruits, whole grains, and beans-are healthier than others.   Finally, stay active  Signed, Thomasene Ripple, DO  12/29/2022 12:03 AM    [facility]

## 2023-01-01 ENCOUNTER — Telehealth: Payer: Self-pay | Admitting: *Deleted

## 2023-01-01 ENCOUNTER — Telehealth (HOSPITAL_COMMUNITY): Payer: Self-pay | Admitting: *Deleted

## 2023-01-01 NOTE — Telephone Encounter (Signed)
Pre-operative Risk Assessment    Patient Name: Gabriel Cook  DOB: 10-02-1970 MRN: [id number]   DATE OF LAST VISIT:  12/25/22 DR. TOBB DATE OF NEXT VISIT: NONE  Request for Surgical Clearance    Procedure:   CERVICAL FUSION  Date of Surgery:  Clearance TBD                                 Surgeon:  DR. Monia Pouch Surgeon's Group or Practice Name:  [facility] Phone number:  [phone] ATTN: Lowella Bandy Fax number:  [phone]   Type of Clearance Requested:   - Medical  - Pharmacy:  Hold Rivaroxaban (Xarelto)     Type of Anesthesia:  General    Additional requests/questions:    Elpidio Anis   01/01/2023, 3:58 PM

## 2023-01-01 NOTE — Telephone Encounter (Signed)
Attempted to call patient regarding upcoming cardiac CT appointment. Left message on voicemail with name and callback number Hayley Sharpe RN Navigator Cardiac Imaging [facility] [phone] Office   

## 2023-01-02 ENCOUNTER — Ambulatory Visit (HOSPITAL_COMMUNITY): Payer: Medicaid Other

## 2023-01-02 DIAGNOSIS — F1011 Alcohol abuse, in remission: Secondary | ICD-10-CM | POA: Insufficient documentation

## 2023-01-02 DIAGNOSIS — I119 Hypertensive heart disease without heart failure: Secondary | ICD-10-CM | POA: Insufficient documentation

## 2023-01-02 DIAGNOSIS — E1165 Type 2 diabetes mellitus with hyperglycemia: Secondary | ICD-10-CM | POA: Insufficient documentation

## 2023-01-02 DIAGNOSIS — I10 Essential (primary) hypertension: Secondary | ICD-10-CM | POA: Insufficient documentation

## 2023-01-02 NOTE — Telephone Encounter (Signed)
Patient with diagnosis of A Fib on Xarelto for anticoagulation.    Procedure: CERVICAL FUSION  Date of procedure: TBD   CHA2DS2-VASc Score = 2  This indicates a 2.2% annual risk of stroke. The patient's score is based upon: CHF History: 0 HTN History: 1 Diabetes History: 1 Stroke History: 0 Vascular Disease History: 0 Age Score: 0 Gender Score: 0   CrCl 139 ml/min using adj body weight Platelet count 168K  Per office protocol, patient can hold Xarelto for 3 days prior to procedure.    **This guidance is not considered finalized until pre-operative APP has relayed final recommendations.**

## 2023-01-04 ENCOUNTER — Telehealth (HOSPITAL_COMMUNITY): Payer: Self-pay | Admitting: *Deleted

## 2023-01-04 NOTE — Telephone Encounter (Signed)
Attempted to call patient regarding upcoming cardiac CT appointment. °Left message on voicemail with name and callback number ° °Merle Prescott RN Navigator Cardiac Imaging °[facility] °[phone] Office °[phone] Cell ° °

## 2023-01-07 ENCOUNTER — Telehealth (HOSPITAL_COMMUNITY): Payer: Self-pay | Admitting: *Deleted

## 2023-01-07 NOTE — Telephone Encounter (Signed)
Reaching out to patient to offer assistance regarding upcoming cardiac imaging study; pt verbalizes understanding of appt date/time, parking situation and where to check in, pre-test NPO status and medications ordered, and verified current allergies; name and call back number provided for further questions should they arise  Larey Brick RN Navigator Cardiac Imaging [facility] [phone] office [phone] cell  Patient to take 100mg  metoprolol tartrate two hours prior to his cardiac CT scan.  He is aware to arrive at 3 PM.

## 2023-01-08 ENCOUNTER — Ambulatory Visit (HOSPITAL_COMMUNITY)
Admission: RE | Admit: 2023-01-08 | Discharge: 2023-01-08 | Disposition: A | Discharge status: Home / Self Care | Payer: Medicaid Other | Source: Ambulatory Visit | Attending: Cardiology | Admitting: Cardiology

## 2023-01-08 DIAGNOSIS — R0609 Other forms of dyspnea: Secondary | ICD-10-CM | POA: Insufficient documentation

## 2023-01-08 DIAGNOSIS — Z01818 Encounter for other preprocedural examination: Secondary | ICD-10-CM | POA: Diagnosis present

## 2023-01-08 DIAGNOSIS — I2510 Atherosclerotic heart disease of native coronary artery without angina pectoris: Secondary | ICD-10-CM

## 2023-01-08 MED ORDER — NITROGLYCERIN 0.4 MG SL SUBL
SUBLINGUAL_TABLET | SUBLINGUAL | Status: AC
  Filled 2023-01-08: qty 2

## 2023-01-08 MED ORDER — IOHEXOL 350 MG/ML SOLN
95.0000 mL | Freq: Once | INTRAVENOUS | Status: AC | PRN
  Administered 2023-01-08: 95 mL via INTRAVENOUS

## 2023-01-08 MED ORDER — NITROGLYCERIN 0.4 MG SL SUBL
0.8000 mg | SUBLINGUAL_TABLET | Freq: Once | SUBLINGUAL | Status: AC
  Administered 2023-01-08: 0.8 mg via SUBLINGUAL

## 2023-01-09 ENCOUNTER — Telehealth: Payer: Self-pay | Admitting: Cardiology

## 2023-01-09 NOTE — Telephone Encounter (Signed)
Patient states he is returning call in regards to results. Requesting return call.

## 2023-01-09 NOTE — Telephone Encounter (Signed)
Spoke with patient he is aware once provider finalize CT results. We will give him a call with results.

## 2023-01-10 ENCOUNTER — Ambulatory Visit (HOSPITAL_COMMUNITY)
Admission: RE | Admit: 2023-01-10 | Discharge: 2023-01-10 | Disposition: A | Discharge status: Home / Self Care | Payer: Medicaid Other | Source: Ambulatory Visit | Attending: Internal Medicine | Admitting: Internal Medicine

## 2023-01-10 ENCOUNTER — Other Ambulatory Visit: Payer: Self-pay | Admitting: Internal Medicine

## 2023-01-10 DIAGNOSIS — R931 Abnormal findings on diagnostic imaging of heart and coronary circulation: Secondary | ICD-10-CM

## 2023-01-11 ENCOUNTER — Telehealth: Payer: Self-pay | Admitting: Family Medicine

## 2023-01-11 ENCOUNTER — Other Ambulatory Visit: Payer: Self-pay | Admitting: Medical

## 2023-01-11 NOTE — Telephone Encounter (Signed)
Patient Name: Gabriel Cook  DOB: [birth year] MRN: [id number]  Primary Cardiologist: Thomasene Ripple, DO  Chart reviewed as part of pre-operative protocol coverage. Given past medical history and time since last visit, based on ACC/AHA guidelines, Gabriel Cook is at acceptable risk for the planned procedure without further cardiovascular testing.  Per Dr. Servando Salina, patient okay to proceed with surgery. Recent coronary CTA showed severe coronary artery disease, though CT FFR showed no hemodynamically significant stenosis.  Per office protocol, patient can hold Xarelto for 3 days prior to procedure.  Please resume Xarelto as soon as possible postprocedure, at the discretion of the surgeon.   I will route this recommendation to the requesting party via Epic fax function and remove from pre-op pool.  Please call with questions.  Joylene Grapes, NP 01/11/2023, 3:19 PM

## 2023-01-11 NOTE — Telephone Encounter (Signed)
Prescription Request  01/11/2023  LOV: 06/28/2022  What is the name of the medication or equipment? tadalafil    Have you contacted your pharmacy to request a refill? No   Which pharmacy would you like this sent to?  [facility] - [place], [place] - [address] [place] [address] Phone: [phone] Fax: [phone]    Patient notified that their request is being sent to the clinical staff for review and that they should receive a response within 2 business days.   Please advise at Mobile [phone] (mobile)

## 2023-01-14 ENCOUNTER — Other Ambulatory Visit: Payer: Self-pay | Admitting: Neurological Surgery

## 2023-01-15 ENCOUNTER — Other Ambulatory Visit: Payer: Self-pay | Admitting: Neurological Surgery

## 2023-01-16 ENCOUNTER — Telehealth: Payer: Self-pay

## 2023-01-16 NOTE — Telephone Encounter (Signed)
Received a request from [facility] for Tadalafil 20 mg.  Please review and advise  Thanks. Dm/cma

## 2023-01-17 NOTE — Telephone Encounter (Signed)
LVM for pt to call the office to be scheduled for an OV before medication can be refilled.

## 2023-01-22 NOTE — Pre-Procedure Instructions (Addendum)
Surgical Instructions   Your procedure is scheduled on January 31, 2023. Report to [facility] Main Entrance "A" at 11:45 A.M., then check in with the Admitting office. Any questions or running late day of surgery: call [phone]  Questions prior to your surgery date: call [phone], Monday-Friday, 8am-4pm. If you experience any cold or flu symptoms such as cough, fever, chills, shortness of breath, etc. between now and your scheduled surgery, please notify us at the above number.     Remember:  Do not eat or drink after midnight the night before your surgery    Take these medicines the morning of surgery with A SIP OF WATER: NONE   May take these medicines IF NEEDED:   STOP taking your Xarelto three days prior to surgery. Your last dose will be September 29th.   One week prior to surgery, STOP taking any Aspirin (unless otherwise instructed by your surgeon) Aleve, Naproxen, Ibuprofen, Motrin, Advil, Goody's, BC's, all herbal medications, fish oil, and non-prescription vitamins.                     Do NOT Smoke (Tobacco/Vaping) for 24 hours prior to your procedure.  If you use a CPAP at night, you may bring your mask/headgear for your overnight stay.   You will be asked to remove any contacts, glasses, piercing's, hearing aid's, dentures/partials prior to surgery. Please bring cases for these items if needed.    Patients discharged the day of surgery will not be allowed to drive home, and someone needs to stay with them for 24 hours.  SURGICAL WAITING ROOM VISITATION Patients may have no more than 2 support people in the waiting area - these visitors may rotate.   Pre-op nurse will coordinate an appropriate time for 1 ADULT support person, who may not rotate, to accompany patient in pre-op.  Children under the age of 46 must have an adult with them who is not the patient and must remain in the main waiting area with an adult.  If the patient needs to stay at the hospital  during part of their recovery, the visitor guidelines for inpatient rooms apply.  Please refer to the [facility] website for the visitor guidelines for any additional information.   If you received a COVID test during your pre-op visit  it is requested that you wear a mask when out in public, stay away from anyone that may not be feeling well and notify your surgeon if you develop symptoms. If you have been in contact with anyone that has tested positive in the last 10 days please notify you surgeon.      Pre-operative 5 CHG Bathing Instructions   You can play a key role in reducing the risk of infection after surgery. Your skin needs to be as free of germs as possible. You can reduce the number of germs on your skin by washing with CHG (chlorhexidine gluconate) soap before surgery. CHG is an antiseptic soap that kills germs and continues to kill germs even after washing.   DO NOT use if you have an allergy to chlorhexidine/CHG or antibacterial soaps. If your skin becomes reddened or irritated, stop using the CHG and notify one of our RNs at [phone].   Please shower with the CHG soap starting 4 days before surgery using the following schedule:     Please keep in mind the following:  DO NOT shave, including legs and underarms, starting the day of your first shower.  You may shave your face at any point before/day of surgery.  Place clean sheets on your bed the day you start using CHG soap. Use a clean washcloth (not used since being washed) for each shower. DO NOT sleep with pets once you start using the CHG.   CHG Shower Instructions:  Wash your face and private area with normal soap. If you choose to wash your hair, wash first with your normal shampoo.  After you use shampoo/soap, rinse your hair and body thoroughly to remove shampoo/soap residue.  Turn the water OFF and apply about 3 tablespoons (45 ml) of CHG soap to a CLEAN washcloth.  Apply CHG soap ONLY FROM YOUR NECK DOWN  TO YOUR TOES (washing for 3-5 minutes)  DO NOT use CHG soap on face, private areas, open wounds, or sores.  Pay special attention to the area where your surgery is being performed.  If you are having back surgery, having someone wash your back for you may be helpful. Wait 2 minutes after CHG soap is applied, then you may rinse off the CHG soap.  Pat dry with a clean towel  Put on clean clothes/pajamas   If you choose to wear lotion, please use ONLY the CHG-compatible lotions on the back of this paper.   Additional instructions for the day of surgery: DO NOT APPLY any lotions, deodorants, cologne, or perfumes.   Do not bring valuables to the hospital. [facility] is not responsible for any belongings/valuables. Do not wear nail polish, gel polish, artificial nails, or any other type of covering on natural nails (fingers and toes) Do not wear jewelry or makeup Put on clean/comfortable clothes.  Please brush your teeth.  Ask your nurse before applying any prescription medications to the skin.     CHG Compatible Lotions   Aveeno Moisturizing lotion  Cetaphil Moisturizing Cream  Cetaphil Moisturizing Lotion  Clairol Herbal Essence Moisturizing Lotion, Dry Skin  Clairol Herbal Essence Moisturizing Lotion, Extra Dry Skin  Clairol Herbal Essence Moisturizing Lotion, Normal Skin  Curel Age Defying Therapeutic Moisturizing Lotion with Alpha Hydroxy  Curel Extreme Care Body Lotion  Curel Soothing Hands Moisturizing Hand Lotion  Curel Therapeutic Moisturizing Cream, Fragrance-Free  Curel Therapeutic Moisturizing Lotion, Fragrance-Free  Curel Therapeutic Moisturizing Lotion, Original Formula  Eucerin Daily Replenishing Lotion  Eucerin Dry Skin Therapy Plus Alpha Hydroxy Crme  Eucerin Dry Skin Therapy Plus Alpha Hydroxy Lotion  Eucerin Original Crme  Eucerin Original Lotion  Eucerin Plus Crme Eucerin Plus Lotion  Eucerin TriLipid Replenishing Lotion  Keri Anti-Bacterial Hand Lotion   Keri Deep Conditioning Original Lotion Dry Skin Formula Softly Scented  Keri Deep Conditioning Original Lotion, Fragrance Free Sensitive Skin Formula  Keri Lotion Fast Absorbing Fragrance Free Sensitive Skin Formula  Keri Lotion Fast Absorbing Softly Scented Dry Skin Formula  Keri Original Lotion  Keri Skin Renewal Lotion Keri Silky Smooth Lotion  Keri Silky Smooth Sensitive Skin Lotion  Nivea Body Creamy Conditioning Oil  Nivea Body Extra Enriched Lotion  Nivea Body Original Lotion  Nivea Body Sheer Moisturizing Lotion Nivea Crme  Nivea Skin Firming Lotion  NutraDerm 30 Skin Lotion  NutraDerm Skin Lotion  NutraDerm Therapeutic Skin Cream  NutraDerm Therapeutic Skin Lotion  ProShield Protective Hand Cream  Provon moisturizing lotion  Please read over the following fact sheets that you were given.

## 2023-01-23 ENCOUNTER — Inpatient Hospital Stay (HOSPITAL_COMMUNITY)
Admission: RE | Admit: 2023-01-23 | Discharge: 2023-01-23 | Disposition: A | Discharge status: Home / Self Care | Payer: Medicaid Other | Source: Ambulatory Visit

## 2023-01-29 ENCOUNTER — Encounter (HOSPITAL_COMMUNITY): Payer: Self-pay | Admitting: Neurological Surgery

## 2023-01-29 NOTE — Progress Notes (Addendum)
Anesthesia Chart Review: Same day workup  52 year old male with pertinent history including paroxysmal A-fib, HTN, DOE.  He is recently evaluated by cardiologist Dr. Servando Salina on 12/25/2022 for complaint of DOE as well as for preop risk stratification.  Coronary CTA was ordered.  Test was done 01/08/2023 and showed moderate to severe CAD, however FFR analysis showed no hemodynamically significant stenosis.  He was cleared per telephone encounter 01/11/2023 by Bernadene Person, NP stating, "Chart reviewed as part of pre-operative protocol coverage. Given past medical history and time since last visit, based on ACC/AHA guidelines, Gabriel Cook is at acceptable risk for the planned procedure without further cardiovascular testing.  Per Dr. Servando Salina, patient okay to proceed with surgery. Recent coronary CTA showed severe coronary artery disease, though CT FFR showed no hemodynamically significant stenosis. Per office protocol, patient can hold Xarelto for 3 days prior to procedure.  Please resume Xarelto as soon as possible postprocedure, at the discretion of the surgeon."  Current every day smoker.  NIDDM2, A1c 5.7 06/28/22.   Patient will need day of surgery labs and evaluation.  EKG 12/25/22 (read per Dr. Mallory Shirk note same date, tracing has not been scanned into Epic): NSR. Rate 88.   Coronary CTA with FFR analysis 01/10/2023: FINDINGS: 1. Left Main: Low likelihood of hemodynamic significance.   2. LAD: Low likelihood of hemodynamic significance. Distalmost FFR of 0.69 is likely due to diffuse disease and vessel tapering. 3. LCX: Low likelihood of hemodynamic significance. 4. RCA: Not mapped.  Left system only.   IMPRESSION: 1.  CT FFR analysis showed no hemodynamically significant stenosis.   RECOMMENDATIONS: Guideline directed medical therapy for secondary prevention of CAD. Given suboptimal CT image quality, if symptoms are refractory to medical therapy, consider PET-CT with myocardial blood flow  or cardiac catheterization.    Zannie Cove [facility]/Anesthesiology Phone [phone] 01/29/2023 10:19 AM

## 2023-01-29 NOTE — Progress Notes (Signed)
SDW call  Patient was given pre-op instructions over the phone. Patient verbalized understanding of instructions provided. Patient clarified his medications; states he was prescribed medications such as crestor, metformin, xaralto, mounjaro etc, but he did not start any of them by his own choice.      PCP - Dr. Nadene Rubins Cardiologist - Dr. Thomasene Ripple Pulmonary:    PPM/ICD - denies Device Orders - na Rep Notified - na   Chest x-ray - na EKG -  12/25/2022 Stress Test - ECHO -  Cardiac Cath -   Sleep Study/sleep apnea/CPAP: denies  Type II diabetic, diet controlled, does not take medications for his diabetes.  States seldom checks his blood sugar.  Fasting Blood sugar range How often check sugars   Blood Thinner Instructions: Lesia Hausen, states he did not start Aspirin Instructions:denies   ERAS Protcol - NPO   COVID TEST- na    Anesthesia review: Yes.  DM, HTN, A-fib   Patient denies shortness of breath, fever, cough and chest pain over the phone call  Your procedure is scheduled on Thursday January 31, 2023  Report to [facility] Main Entrance "A" at  1115  A.M., then check in with the Admitting office.  Call this number if you have problems the morning of surgery:  [phone]   If you have any questions prior to your surgery date call [phone]: Open Monday-Friday 8am-4pm If you experience any cold or flu symptoms such as cough, fever, chills, shortness of breath, etc. between now and your scheduled surgery, please notify us at the above number    Remember:  Do not eat or drink after midnight the night before your surgery  Take these medicines the morning of surgery with A SIP OF WATER: None  As of today, STOP taking any Aspirin (unless otherwise instructed by your surgeon) Aleve, Naproxen, Ibuprofen, Motrin, Advil, Goody's, BC's, all herbal medications, fish oil, and all vitamins.

## 2023-01-29 NOTE — Anesthesia Preprocedure Evaluation (Signed)
Anesthesia Evaluation  Patient identified by MRN, date of birth, ID band Patient awake    Reviewed: Allergy & Precautions, NPO status , Patient's Chart, lab work & pertinent test results  Airway Mallampati: III  TM Distance: >3 FB Neck ROM: Limited    Dental  (+) Teeth Intact, Dental Advisory Given, Poor Dentition   Pulmonary Current Smoker   Pulmonary exam normal breath sounds clear to auscultation       Cardiovascular hypertension, + CAD  + dysrhythmias Atrial Fibrillation  Rhythm:Regular Rate:Normal     Neuro/Psych CERVICAL MYELOPATHY  negative psych ROS   GI/Hepatic negative GI ROS,,,(+)     substance abuse  alcohol use  Endo/Other  diabetes, Poorly Controlled, Type 2  Obesity   Renal/GU negative Renal ROS     Musculoskeletal  (+) Arthritis ,    Abdominal   Peds  Hematology negative hematology ROS (+)   Anesthesia Other Findings Day of surgery medications reviewed with the patient.  Reproductive/Obstetrics                             Anesthesia Physical Anesthesia Plan  ASA: 3  Anesthesia Plan: General   Post-op Pain Management: Tylenol PO (pre-op)*   Induction: Intravenous  PONV Risk Score and Plan: 2 and Midazolam, Dexamethasone and Ondansetron  Airway Management Planned: Oral ETT and Video Laryngoscope Planned  Additional Equipment: ClearSight  Intra-op Plan:   Post-operative Plan: Extubation in OR  Informed Consent: I have reviewed the patients History and Physical, chart, labs and discussed the procedure including the risks, benefits and alternatives for the proposed anesthesia with the patient or authorized representative who has indicated his/her understanding and acceptance.     Dental advisory given  Plan Discussed with: CRNA  Anesthesia Plan Comments: (2nd PIV after induction  PAT note by Antionette Poles, PA-C: 52 year old male with pertinent history  including paroxysmal A-fib, HTN, DOE.  He is recently evaluated by cardiologist Dr. Servando Salina on 12/25/2022 for complaint of DOE as well as for preop risk stratification.  Coronary CTA was ordered.  Test was done 01/08/2023 and showed moderate to severe CAD, however FFR analysis showed no hemodynamically significant stenosis.  He was cleared per telephone encounter 01/11/2023 by Bernadene Person, NP stating, "Chart reviewed as part of pre-operative protocol coverage. Given past medical history and time since last visit, based on ACC/AHA guidelines, Jaren Vanetten is at acceptable risk for the planned procedure without further cardiovascular testing.  Per Dr. Servando Salina, patient okay to proceed with surgery. Recent coronary CTA showed severe coronary artery disease, though CT FFR showed no hemodynamically significant stenosis. Per office protocol, patient can hold Xarelto for 3 days prior to procedure.  Please resume Xarelto as soon as possible postprocedure, at the discretion of the surgeon."  Current every day smoker.  NIDDM2, A1c 5.7 06/28/22.   Patient will need day of surgery labs and evaluation.  EKG 12/25/22 (read per Dr. Mallory Shirk note same date, tracing has not been scanned into Epic): NSR. Rate 88.   Coronary CTA with FFR analysis 01/10/2023: FINDINGS: 1. Left Main: Low likelihood of hemodynamic significance.  2. LAD: Low likelihood of hemodynamic significance. Distalmost FFR of 0.69 is likely due to diffuse disease and vessel tapering. 3. LCX: Low likelihood of hemodynamic significance. 4. RCA: Not mapped.  Left system only.  IMPRESSION: 1.  CT FFR analysis showed no hemodynamically significant stenosis.  RECOMMENDATIONS: Guideline directed medical therapy for secondary prevention of CAD. Given  suboptimal CT image quality, if symptoms are refractory to medical therapy, consider PET-CT with myocardial blood flow or cardiac catheterization.  )        Anesthesia Quick Evaluation

## 2023-01-31 ENCOUNTER — Encounter (HOSPITAL_COMMUNITY)
Admission: RE | Disposition: A | Discharge status: Skilled Nursing Facility | Payer: Self-pay | Source: Home / Self Care | Attending: Neurological Surgery

## 2023-01-31 ENCOUNTER — Other Ambulatory Visit: Payer: Self-pay

## 2023-01-31 ENCOUNTER — Inpatient Hospital Stay (HOSPITAL_COMMUNITY): Payer: Medicaid Other

## 2023-01-31 ENCOUNTER — Inpatient Hospital Stay (HOSPITAL_COMMUNITY): Payer: Self-pay | Admitting: Physician Assistant

## 2023-01-31 ENCOUNTER — Encounter (HOSPITAL_COMMUNITY): Payer: Self-pay | Admitting: Neurological Surgery

## 2023-01-31 ENCOUNTER — Inpatient Hospital Stay (HOSPITAL_COMMUNITY)
Admission: RE | Admit: 2023-01-31 | Discharge: 2023-02-09 | DRG: 472 | Disposition: A | Discharge status: Skilled Nursing Facility | Payer: Medicaid Other | Attending: Neurological Surgery | Admitting: Neurological Surgery

## 2023-01-31 DIAGNOSIS — I480 Paroxysmal atrial fibrillation: Secondary | ICD-10-CM | POA: Diagnosis present

## 2023-01-31 DIAGNOSIS — M4802 Spinal stenosis, cervical region: Secondary | ICD-10-CM | POA: Diagnosis not present

## 2023-01-31 DIAGNOSIS — Z0189 Encounter for other specified special examinations: Secondary | ICD-10-CM | POA: Diagnosis not present

## 2023-01-31 DIAGNOSIS — Z8249 Family history of ischemic heart disease and other diseases of the circulatory system: Secondary | ICD-10-CM

## 2023-01-31 DIAGNOSIS — M4803 Spinal stenosis, cervicothoracic region: Secondary | ICD-10-CM | POA: Diagnosis not present

## 2023-01-31 DIAGNOSIS — G992 Myelopathy in diseases classified elsewhere: Secondary | ICD-10-CM | POA: Diagnosis not present

## 2023-01-31 DIAGNOSIS — I2510 Atherosclerotic heart disease of native coronary artery without angina pectoris: Secondary | ICD-10-CM | POA: Diagnosis not present

## 2023-01-31 DIAGNOSIS — E669 Obesity, unspecified: Secondary | ICD-10-CM | POA: Diagnosis present

## 2023-01-31 DIAGNOSIS — E782 Mixed hyperlipidemia: Secondary | ICD-10-CM | POA: Diagnosis not present

## 2023-01-31 DIAGNOSIS — I10 Essential (primary) hypertension: Secondary | ICD-10-CM | POA: Diagnosis present

## 2023-01-31 DIAGNOSIS — E119 Type 2 diabetes mellitus without complications: Secondary | ICD-10-CM | POA: Diagnosis not present

## 2023-01-31 DIAGNOSIS — Z833 Family history of diabetes mellitus: Secondary | ICD-10-CM | POA: Diagnosis not present

## 2023-01-31 DIAGNOSIS — F17210 Nicotine dependence, cigarettes, uncomplicated: Secondary | ICD-10-CM | POA: Diagnosis present

## 2023-01-31 DIAGNOSIS — Z6838 Body mass index (BMI) 38.0-38.9, adult: Secondary | ICD-10-CM | POA: Diagnosis not present

## 2023-01-31 DIAGNOSIS — G959 Disease of spinal cord, unspecified: Secondary | ICD-10-CM | POA: Diagnosis not present

## 2023-01-31 DIAGNOSIS — M4313 Spondylolisthesis, cervicothoracic region: Secondary | ICD-10-CM | POA: Diagnosis not present

## 2023-01-31 DIAGNOSIS — M1990 Unspecified osteoarthritis, unspecified site: Secondary | ICD-10-CM | POA: Diagnosis present

## 2023-01-31 DIAGNOSIS — Z981 Arthrodesis status: Secondary | ICD-10-CM | POA: Diagnosis not present

## 2023-01-31 DIAGNOSIS — I4891 Unspecified atrial fibrillation: Secondary | ICD-10-CM | POA: Diagnosis not present

## 2023-01-31 DIAGNOSIS — M4804 Spinal stenosis, thoracic region: Secondary | ICD-10-CM | POA: Diagnosis present

## 2023-01-31 DIAGNOSIS — M4712 Other spondylosis with myelopathy, cervical region: Secondary | ICD-10-CM | POA: Diagnosis present

## 2023-01-31 HISTORY — PX: POSTERIOR CERVICAL FUSION/FORAMINOTOMY: SHX5038

## 2023-01-31 LAB — COMPREHENSIVE METABOLIC PANEL
ALT: 34 U/L (ref 0–44)
AST: 25 U/L (ref 15–41)
Albumin: 3.4 g/dL — ABNORMAL LOW (ref 3.5–5.0)
Alkaline Phosphatase: 64 U/L (ref 38–126)
Anion gap: 9 (ref 5–15)
BUN: 8 mg/dL (ref 6–20)
CO2: 19 mmol/L — ABNORMAL LOW (ref 22–32)
Calcium: 8.6 mg/dL — ABNORMAL LOW (ref 8.9–10.3)
Chloride: 107 mmol/L (ref 98–111)
Creatinine, Ser: 0.91 mg/dL (ref 0.61–1.24)
GFR, Estimated: >60 mL/min (ref >60)
Glucose, Bld: 109 mg/dL — ABNORMAL HIGH (ref 70–99)
Potassium: 4 mmol/L (ref 3.5–5.1)
Sodium: 135 mmol/L (ref 135–145)
Total Bilirubin: 0.7 mg/dL (ref 0.3–1.2)
Total Protein: 6.7 g/dL (ref 6.5–8.1)

## 2023-01-31 LAB — CBC
HCT: 46.5 % (ref 39.0–52.0)
Hemoglobin: 14.9 g/dL (ref 13.0–17.0)
MCH: 28.7 pg (ref 26.0–34.0)
MCHC: 32 g/dL (ref 30.0–36.0)
MCV: 89.4 fL (ref 80.0–100.0)
Platelets: 178 10*3/uL (ref 150–400)
RBC: 5.2 MIL/uL (ref 4.22–5.81)
RDW: 13 % (ref 11.5–15.5)
WBC: 4.5 10*3/uL (ref 4.0–10.5)
nRBC: 0 % (ref 0.0–0.2)

## 2023-01-31 LAB — GLUCOSE, CAPILLARY
Glucose-Capillary: 119 mg/dL — ABNORMAL HIGH (ref 70–99)
Glucose-Capillary: 110 mg/dL — ABNORMAL HIGH (ref 70–99)
Glucose-Capillary: 156 mg/dL — ABNORMAL HIGH (ref 70–99)

## 2023-01-31 LAB — SURGICAL PCR SCREEN
MRSA, PCR: NEGATIVE
Staphylococcus aureus: NEGATIVE

## 2023-01-31 LAB — TYPE AND SCREEN
ABO/RH(D): O POS
Antibody Screen: NEGATIVE

## 2023-01-31 LAB — ABO/RH: ABO/RH(D): O POS

## 2023-01-31 SURGERY — POSTERIOR CERVICAL FUSION/FORAMINOTOMY LEVEL 5
Anesthesia: General | Site: Neck

## 2023-01-31 MED ORDER — MENTHOL 3 MG MT LOZG: 1.0000 | LOZENGE | OROMUCOSAL | Status: DC | PRN

## 2023-01-31 MED ORDER — AMISULPRIDE (ANTIEMETIC) 5 MG/2ML IV SOLN: 10.0000 mg | Freq: Once | INTRAVENOUS | Status: DC | PRN

## 2023-01-31 MED ORDER — ESMOLOL HCL 100 MG/10ML IV SOLN
INTRAVENOUS | Status: DC | PRN
Start: 2023-01-31 — End: 2023-01-31
  Administered 2023-01-31: 20 mg via INTRAVENOUS

## 2023-01-31 MED ORDER — PHENYLEPHRINE 80 MCG/ML (10ML) SYRINGE FOR IV PUSH (FOR BLOOD PRESSURE SUPPORT)
PREFILLED_SYRINGE | INTRAVENOUS | Status: DC | PRN
  Administered 2023-01-31: 80 ug via INTRAVENOUS

## 2023-01-31 MED ORDER — SUGAMMADEX SODIUM 200 MG/2ML IV SOLN
INTRAVENOUS | Status: DC | PRN
  Administered 2023-01-31: 200 mg via INTRAVENOUS

## 2023-01-31 MED ORDER — LIDOCAINE 2% (20 MG/ML) 5 ML SYRINGE
INTRAMUSCULAR | Status: DC | PRN
  Administered 2023-01-31: 80 mg via INTRAVENOUS

## 2023-01-31 MED ORDER — ROCURONIUM BROMIDE 10 MG/ML (PF) SYRINGE
PREFILLED_SYRINGE | INTRAVENOUS | Status: DC | PRN
  Administered 2023-01-31: 100 mg via INTRAVENOUS
  Administered 2023-01-31: 20 mg via INTRAVENOUS
  Administered 2023-01-31: 30 mg via INTRAVENOUS

## 2023-01-31 MED ORDER — THROMBIN 5000 UNITS EX SOLR
OROMUCOSAL | Status: DC | PRN
  Administered 2023-01-31: 5 mL via TOPICAL

## 2023-01-31 MED ORDER — FENTANYL CITRATE (PF) 100 MCG/2ML IJ SOLN
25.0000 ug | INTRAMUSCULAR | Status: DC | PRN
  Administered 2023-01-31 (×3): 50 ug via INTRAVENOUS

## 2023-01-31 MED ORDER — LIDOCAINE-EPINEPHRINE 1 %-1:100000 IJ SOLN
INTRAMUSCULAR | Status: DC | PRN
  Administered 2023-01-31: 5 mL

## 2023-01-31 MED ORDER — HYDROMORPHONE HCL 1 MG/ML IJ SOLN
INTRAMUSCULAR | Status: DC | PRN
  Administered 2023-01-31: .5 mg via INTRAVENOUS

## 2023-01-31 MED ORDER — KETAMINE HCL 50 MG/5ML IJ SOSY
PREFILLED_SYRINGE | INTRAMUSCULAR | Status: AC
  Filled 2023-01-31: qty 5

## 2023-01-31 MED ORDER — ORAL CARE MOUTH RINSE: 15.0000 mL | Freq: Once | OROMUCOSAL | Status: AC

## 2023-01-31 MED ORDER — CEFAZOLIN IN SODIUM CHLORIDE 3-0.9 GM/100ML-% IV SOLN
3.0000 g | INTRAVENOUS | Status: AC
  Administered 2023-01-31: 3 g via INTRAVENOUS
  Filled 2023-01-31: qty 100

## 2023-01-31 MED ORDER — 0.9 % SODIUM CHLORIDE (POUR BTL) OPTIME
TOPICAL | Status: DC | PRN
  Administered 2023-01-31: 1000 mL

## 2023-01-31 MED ORDER — PROPOFOL 10 MG/ML IV BOLUS
INTRAVENOUS | Status: DC | PRN
Start: 2023-01-31 — End: 2023-01-31
  Administered 2023-01-31: 70 mg via INTRAVENOUS
  Administered 2023-01-31: 200 mg via INTRAVENOUS

## 2023-01-31 MED ORDER — BUPIVACAINE LIPOSOME 1.3 % IJ SUSP
INTRAMUSCULAR | Status: DC | PRN
  Administered 2023-01-31: 20 mL

## 2023-01-31 MED ORDER — HYDROMORPHONE HCL 1 MG/ML IJ SOLN
0.2500 mg | INTRAMUSCULAR | Status: DC | PRN
  Administered 2023-01-31: 0.5 mg via INTRAVENOUS

## 2023-01-31 MED ORDER — ACETAMINOPHEN 650 MG RE SUPP: 650.0000 mg | RECTAL | Status: DC | PRN

## 2023-01-31 MED ORDER — PHENOL 1.4 % MT LIQD: 1.0000 | OROMUCOSAL | Status: DC | PRN

## 2023-01-31 MED ORDER — CHLORHEXIDINE GLUCONATE CLOTH 2 % EX PADS: 6.0000 | MEDICATED_PAD | Freq: Once | CUTANEOUS | Status: DC

## 2023-01-31 MED ORDER — DEXAMETHASONE SODIUM PHOSPHATE 10 MG/ML IJ SOLN
INTRAMUSCULAR | Status: DC | PRN
  Administered 2023-01-31: 10 mg via INTRAVENOUS

## 2023-01-31 MED ORDER — OXYCODONE HCL 5 MG PO TABS
ORAL_TABLET | ORAL | Status: AC
  Filled 2023-01-31: qty 1

## 2023-01-31 MED ORDER — LABETALOL HCL 5 MG/ML IV SOLN
5.0000 mg | INTRAVENOUS | Status: DC | PRN
  Administered 2023-01-31 (×2): 5 mg via INTRAVENOUS

## 2023-01-31 MED ORDER — DOCUSATE SODIUM 100 MG PO CAPS
100.0000 mg | ORAL_CAPSULE | Freq: Two times a day (BID) | ORAL | Status: DC
  Administered 2023-01-31 – 2023-02-09 (×11): 100 mg via ORAL
  Filled 2023-01-31 (×15): qty 1

## 2023-01-31 MED ORDER — ROCURONIUM BROMIDE 10 MG/ML (PF) SYRINGE
PREFILLED_SYRINGE | INTRAVENOUS | Status: AC
  Filled 2023-01-31: qty 10

## 2023-01-31 MED ORDER — THROMBIN 20000 UNITS EX SOLR
CUTANEOUS | Status: DC | PRN
  Administered 2023-01-31: 20 mL via TOPICAL

## 2023-01-31 MED ORDER — VANCOMYCIN HCL 1000 MG IV SOLR
INTRAVENOUS | Status: AC
  Filled 2023-01-31: qty 20

## 2023-01-31 MED ORDER — LACTATED RINGERS IV SOLN: INTRAVENOUS | Status: DC | PRN

## 2023-01-31 MED ORDER — SODIUM CHLORIDE 0.9% FLUSH
3.0000 mL | Freq: Two times a day (BID) | INTRAVENOUS | Status: DC
  Administered 2023-01-31 – 2023-02-03 (×6): 3 mL via INTRAVENOUS

## 2023-01-31 MED ORDER — BACITRACIN ZINC 500 UNIT/GM EX OINT
TOPICAL_OINTMENT | CUTANEOUS | Status: AC
  Filled 2023-01-31: qty 28.35

## 2023-01-31 MED ORDER — CHLORHEXIDINE GLUCONATE CLOTH 2 % EX PADS
6.0000 | MEDICATED_PAD | Freq: Once | CUTANEOUS | Status: AC
  Administered 2023-01-31: 6 via TOPICAL

## 2023-01-31 MED ORDER — FENTANYL CITRATE (PF) 100 MCG/2ML IJ SOLN
INTRAMUSCULAR | Status: AC
  Filled 2023-01-31: qty 2

## 2023-01-31 MED ORDER — SODIUM CHLORIDE 0.9 % IV SOLN: 250.0000 mL | INTRAVENOUS | Status: DC

## 2023-01-31 MED ORDER — HYDROMORPHONE HCL 1 MG/ML IJ SOLN
INTRAMUSCULAR | Status: AC
  Filled 2023-01-31: qty 0.5

## 2023-01-31 MED ORDER — FENTANYL CITRATE (PF) 250 MCG/5ML IJ SOLN
INTRAMUSCULAR | Status: DC | PRN
Start: 2023-01-31 — End: 2023-01-31
  Administered 2023-01-31 (×2): 50 ug via INTRAVENOUS

## 2023-01-31 MED ORDER — MIDAZOLAM HCL 2 MG/2ML IJ SOLN
INTRAMUSCULAR | Status: AC
  Filled 2023-01-31: qty 2

## 2023-01-31 MED ORDER — ONDANSETRON HCL 4 MG/2ML IJ SOLN
INTRAMUSCULAR | Status: DC | PRN
  Administered 2023-01-31: 4 mg via INTRAVENOUS

## 2023-01-31 MED ORDER — FENTANYL CITRATE (PF) 250 MCG/5ML IJ SOLN
INTRAMUSCULAR | Status: AC
  Filled 2023-01-31: qty 5

## 2023-01-31 MED ORDER — PROPOFOL 10 MG/ML IV BOLUS
INTRAVENOUS | Status: AC
  Filled 2023-01-31: qty 20

## 2023-01-31 MED ORDER — MIDAZOLAM HCL 2 MG/2ML IJ SOLN
INTRAMUSCULAR | Status: DC | PRN
  Administered 2023-01-31: 2 mg via INTRAVENOUS

## 2023-01-31 MED ORDER — PHENYLEPHRINE 80 MCG/ML (10ML) SYRINGE FOR IV PUSH (FOR BLOOD PRESSURE SUPPORT)
PREFILLED_SYRINGE | INTRAVENOUS | Status: AC
  Filled 2023-01-31: qty 10

## 2023-01-31 MED ORDER — OXYCODONE HCL 5 MG PO TABS
5.0000 mg | ORAL_TABLET | ORAL | Status: DC | PRN
  Administered 2023-01-31 – 2023-02-05 (×6): 5 mg via ORAL
  Filled 2023-01-31 (×5): qty 1

## 2023-01-31 MED ORDER — THROMBIN 5000 UNITS EX SOLR
CUTANEOUS | Status: AC
  Filled 2023-01-31: qty 5000

## 2023-01-31 MED ORDER — BUPIVACAINE-EPINEPHRINE (PF) 0.5% -1:200000 IJ SOLN
INTRAMUSCULAR | Status: DC | PRN
  Administered 2023-01-31: 5 mL

## 2023-01-31 MED ORDER — LACTATED RINGERS IV SOLN: INTRAVENOUS | Status: DC

## 2023-01-31 MED ORDER — LABETALOL HCL 5 MG/ML IV SOLN
INTRAVENOUS | Status: AC
  Filled 2023-01-31: qty 4

## 2023-01-31 MED ORDER — PHENYLEPHRINE HCL-NACL 20-0.9 MG/250ML-% IV SOLN
INTRAVENOUS | Status: DC | PRN
  Administered 2023-01-31: 20 ug/min via INTRAVENOUS

## 2023-01-31 MED ORDER — CEFAZOLIN SODIUM-DEXTROSE 2-4 GM/100ML-% IV SOLN
2.0000 g | Freq: Three times a day (TID) | INTRAVENOUS | Status: AC
  Administered 2023-01-31 – 2023-02-01 (×2): 2 g via INTRAVENOUS
  Filled 2023-01-31 (×2): qty 100

## 2023-01-31 MED ORDER — METHOCARBAMOL 500 MG PO TABS
500.0000 mg | ORAL_TABLET | Freq: Four times a day (QID) | ORAL | Status: DC | PRN
  Administered 2023-01-31 – 2023-02-09 (×22): 500 mg via ORAL
  Filled 2023-01-31 (×21): qty 1

## 2023-01-31 MED ORDER — LIDOCAINE-EPINEPHRINE 1 %-1:100000 IJ SOLN
INTRAMUSCULAR | Status: AC
  Filled 2023-01-31: qty 1

## 2023-01-31 MED ORDER — METHOCARBAMOL 1000 MG/10ML IJ SOLN: 500.0000 mg | Freq: Four times a day (QID) | INTRAVENOUS | Status: DC | PRN

## 2023-01-31 MED ORDER — THROMBIN 20000 UNITS EX SOLR
CUTANEOUS | Status: AC
  Filled 2023-01-31: qty 20000

## 2023-01-31 MED ORDER — KETAMINE HCL 10 MG/ML IJ SOLN
INTRAMUSCULAR | Status: DC | PRN
Start: 2023-01-31 — End: 2023-01-31
  Administered 2023-01-31 (×2): 10 mg via INTRAVENOUS
  Administered 2023-01-31 (×2): 15 mg via INTRAVENOUS

## 2023-01-31 MED ORDER — ONDANSETRON HCL 4 MG/2ML IJ SOLN: 4.0000 mg | Freq: Four times a day (QID) | INTRAMUSCULAR | Status: DC | PRN

## 2023-01-31 MED ORDER — OXYCODONE HCL 5 MG PO TABS
10.0000 mg | ORAL_TABLET | ORAL | Status: DC | PRN
  Administered 2023-02-01 – 2023-02-08 (×25): 10 mg via ORAL
  Filled 2023-01-31 (×26): qty 2

## 2023-01-31 MED ORDER — HYDROMORPHONE HCL 1 MG/ML IJ SOLN
INTRAMUSCULAR | Status: AC
  Filled 2023-01-31: qty 1

## 2023-01-31 MED ORDER — BACITRACIN ZINC 500 UNIT/GM EX OINT
TOPICAL_OINTMENT | CUTANEOUS | Status: DC | PRN
Start: 2023-01-31 — End: 2023-01-31
  Administered 2023-01-31: 1 via TOPICAL

## 2023-01-31 MED ORDER — INSULIN ASPART 100 UNIT/ML IJ SOLN: 0.0000 [IU] | INTRAMUSCULAR | Status: DC | PRN

## 2023-01-31 MED ORDER — PHENYLEPHRINE HCL-NACL 20-0.9 MG/250ML-% IV SOLN: INTRAVENOUS | Status: DC | PRN

## 2023-01-31 MED ORDER — SODIUM CHLORIDE 0.9% FLUSH: 3.0000 mL | INTRAVENOUS | Status: DC | PRN

## 2023-01-31 MED ORDER — BUPIVACAINE HCL (PF) 0.5 % IJ SOLN
INTRAMUSCULAR | Status: AC
  Filled 2023-01-31: qty 30

## 2023-01-31 MED ORDER — CHLORHEXIDINE GLUCONATE 0.12 % MT SOLN: 15.0000 mL | Freq: Once | OROMUCOSAL | Status: AC

## 2023-01-31 MED ORDER — SODIUM CHLORIDE 0.9 % IV SOLN: INTRAVENOUS | Status: DC

## 2023-01-31 MED ORDER — HYDROMORPHONE HCL 1 MG/ML IJ SOLN
0.5000 mg | INTRAMUSCULAR | Status: DC | PRN
  Administered 2023-01-31 – 2023-02-04 (×7): 0.5 mg via INTRAVENOUS
  Filled 2023-01-31 (×7): qty 0.5

## 2023-01-31 MED ORDER — CHLORHEXIDINE GLUCONATE 0.12 % MT SOLN
OROMUCOSAL | Status: AC
  Administered 2023-01-31: 15 mL via OROMUCOSAL
  Filled 2023-01-31: qty 15

## 2023-01-31 MED ORDER — BUPIVACAINE LIPOSOME 1.3 % IJ SUSP
INTRAMUSCULAR | Status: AC
  Filled 2023-01-31: qty 20

## 2023-01-31 MED ORDER — ONDANSETRON HCL 4 MG/2ML IJ SOLN: 4.0000 mg | Freq: Once | INTRAMUSCULAR | Status: DC | PRN

## 2023-01-31 MED ORDER — ACETAMINOPHEN 500 MG PO TABS
1000.0000 mg | ORAL_TABLET | Freq: Once | ORAL | Status: AC
  Administered 2023-01-31: 1000 mg via ORAL
  Filled 2023-01-31: qty 2

## 2023-01-31 MED ORDER — EPHEDRINE SULFATE-NACL 50-0.9 MG/10ML-% IV SOSY
PREFILLED_SYRINGE | INTRAVENOUS | Status: DC | PRN
  Administered 2023-01-31: 5 mg via INTRAVENOUS

## 2023-01-31 MED ORDER — METHOCARBAMOL 500 MG PO TABS
ORAL_TABLET | ORAL | Status: AC
  Filled 2023-01-31: qty 1

## 2023-01-31 MED ORDER — ONDANSETRON HCL 4 MG PO TABS: 4.0000 mg | ORAL_TABLET | Freq: Four times a day (QID) | ORAL | Status: DC | PRN

## 2023-01-31 MED ORDER — ACETAMINOPHEN 325 MG PO TABS
650.0000 mg | ORAL_TABLET | ORAL | Status: DC | PRN
  Administered 2023-02-02 – 2023-02-04 (×3): 650 mg via ORAL
  Filled 2023-01-31 (×3): qty 2

## 2023-01-31 MED ORDER — GLYCOPYRROLATE 0.2 MG/ML IJ SOLN
INTRAMUSCULAR | Status: DC | PRN
  Administered 2023-01-31: .1 mg via INTRAVENOUS

## 2023-01-31 MED ORDER — VANCOMYCIN HCL 1000 MG IV SOLR
INTRAVENOUS | Status: DC | PRN
Start: 2023-01-31 — End: 2023-01-31
  Administered 2023-01-31: 1000 mg

## 2023-01-31 SURGICAL SUPPLY — 77 items
ADH SKN CLS APL DERMABOND .7 (GAUZE/BANDAGES/DRESSINGS) ×1
BAG COUNTER SPONGE SURGICOUNT (BAG) ×1 IMPLANT
BAG SPNG CNTER NS LX DISP (BAG) ×1
BIT DRILL NEURO 2X3.1 SFT TUCH (MISCELLANEOUS) ×1 IMPLANT
BLADE BONE MILL MEDIUM (MISCELLANEOUS) IMPLANT
BNDG GAUZE DERMACEA FLUFF 4 (GAUZE/BANDAGES/DRESSINGS) ×1 IMPLANT
BNDG GZE DERMACEA 4 6PLY (GAUZE/BANDAGES/DRESSINGS) ×1
BUR MATCHSTICK NEURO 3.0 LAGG (BURR) IMPLANT
BUR SABER NEURO 2.5 (BURR) IMPLANT
CNTNR URN SCR LID CUP LEK RST (MISCELLANEOUS) ×1 IMPLANT
CONT SPEC 4OZ STRL OR WHT (MISCELLANEOUS) ×1
COVER BACK TABLE 60X90IN (DRAPES) ×1 IMPLANT
COVER MAYO STAND STRL (DRAPES) ×1 IMPLANT
DERMABOND ADVANCED .7 DNX12 (GAUZE/BANDAGES/DRESSINGS) ×1 IMPLANT
DRAIN JACKSON RD 7FR 3/32 (WOUND CARE) IMPLANT
DRAPE C-ARM 42X72 X-RAY (DRAPES) ×1 IMPLANT
DRAPE LAPAROTOMY 100X72X124 (DRAPES) ×1 IMPLANT
DRAPE MICROSCOPE SLANT 54X150 (MISCELLANEOUS) IMPLANT
DRAPE SURG 17X23 STRL (DRAPES) ×1 IMPLANT
DRILL NEURO 2X3.1 SOFT TOUCH (MISCELLANEOUS) ×1
DRSG OPSITE POSTOP 4X6 (GAUZE/BANDAGES/DRESSINGS) ×1 IMPLANT
DRSG OPSITE POSTOP 4X8 (GAUZE/BANDAGES/DRESSINGS) IMPLANT
DURAPREP 26ML APPLICATOR (WOUND CARE) ×1 IMPLANT
ELECT BLADE INSULATED 4IN (ELECTROSURGICAL)
ELECT BLADE INSULATED 6.5IN (ELECTROSURGICAL)
ELECT COATED BLADE 2.86 ST (ELECTRODE) ×1 IMPLANT
ELECT REM PT RETURN 9FT ADLT (ELECTROSURGICAL) ×1
ELECTRODE BLADE INSULATED 4IN (ELECTROSURGICAL) IMPLANT
ELECTRODE BLDE INSULATED 6.5IN (ELECTROSURGICAL) IMPLANT
ELECTRODE REM PT RTRN 9FT ADLT (ELECTROSURGICAL) ×1 IMPLANT
GAUZE 4X4 16PLY ~~LOC~~+RFID DBL (SPONGE) IMPLANT
GAUZE SPONGE 4X4 12PLY STRL (GAUZE/BANDAGES/DRESSINGS) IMPLANT
GLOVE BIO SURGEON STRL SZ7 (GLOVE) ×1 IMPLANT
GLOVE BIOGEL PI IND STRL 7.5 (GLOVE) ×1 IMPLANT
GLOVE BIOGEL PI IND STRL 8 (GLOVE) ×1 IMPLANT
GLOVE ECLIPSE 8.0 STRL XLNG CF (GLOVE) ×1 IMPLANT
GOWN STRL REUS W/ TWL LRG LVL3 (GOWN DISPOSABLE) IMPLANT
GOWN STRL REUS W/ TWL XL LVL3 (GOWN DISPOSABLE) ×2 IMPLANT
GOWN STRL REUS W/TWL 2XL LVL3 (GOWN DISPOSABLE) IMPLANT
GOWN STRL REUS W/TWL LRG LVL3 (GOWN DISPOSABLE)
GOWN STRL REUS W/TWL XL LVL3 (GOWN DISPOSABLE) ×2
HEMOSTAT POWDER KIT SURGIFOAM (HEMOSTASIS) ×1 IMPLANT
KIT BASIN OR (CUSTOM PROCEDURE TRAY) ×1 IMPLANT
KIT TURNOVER KIT B (KITS) ×1 IMPLANT
MARKER SKIN DUAL TIP RULER LAB (MISCELLANEOUS) ×1 IMPLANT
NDL BLUNT 18X1 FOR OR ONLY (NEEDLE) IMPLANT
NDL HYPO 25X1 1.5 SAFETY (NEEDLE) ×1 IMPLANT
NDL SPNL 18GX3.5 QUINCKE PK (NEEDLE) ×2 IMPLANT
NEEDLE BLUNT 18X1 FOR OR ONLY (NEEDLE)
NEEDLE HYPO 25X1 1.5 SAFETY (NEEDLE) ×1
NEEDLE SPNL 18GX3.5 QUINCKE PK (NEEDLE) ×2
NS IRRIG 1000ML POUR BTL (IV SOLUTION) ×1 IMPLANT
PACK LAMINECTOMY NEURO (CUSTOM PROCEDURE TRAY) ×1 IMPLANT
PAD ARMBOARD 7.5X6 YLW CONV (MISCELLANEOUS) ×3 IMPLANT
PATTIES SURGICAL .5 X1 (DISPOSABLE) IMPLANT
PUTTY BONE 100 VESUVIUS 2.5CC (Putty) IMPLANT
ROD STRT CASP 3.5X120 NS (Rod) IMPLANT
SCREW PA YUKON 3.5X12 (Screw) IMPLANT
SCREW PA YUKON 4.5X26 (Screw) IMPLANT
SCREW SET SPINAL YUKON (Set) ×11 IMPLANT
SCREW SPIN YUKON POLY 03.5X14 (Screw) IMPLANT
SPIKE FLUID TRANSFER (MISCELLANEOUS) ×1 IMPLANT
SPONGE SURGIFOAM ABS GEL 100 (HEMOSTASIS) ×1 IMPLANT
SPONGE T-LAP 4X18 ~~LOC~~+RFID (SPONGE) IMPLANT
STAPLER VISISTAT 35W (STAPLE) IMPLANT
SUT VIC AB 1 CT1 18XBRD ANBCTR (SUTURE) ×1 IMPLANT
SUT VIC AB 1 CT1 8-18 (SUTURE) ×1
SUT VIC AB 2-0 CP2 18 (SUTURE) ×1 IMPLANT
SUT VIC AB 3-0 SH 8-18 (SUTURE) ×1 IMPLANT
SYR 3ML LL SCALE MARK (SYRINGE) ×1 IMPLANT
TAP YUKON 3.0 DISP (TAP) IMPLANT
TAP YUKON 3.5 DISP (TAP) IMPLANT
TAP YUKON 4.0 DISP (TAP) IMPLANT
TOWEL GREEN STERILE (TOWEL DISPOSABLE) ×1 IMPLANT
TOWEL GREEN STERILE FF (TOWEL DISPOSABLE) ×1 IMPLANT
TRAY FOLEY MTR SLVR 16FR STAT (SET/KITS/TRAYS/PACK) ×1 IMPLANT
WATER STERILE IRR 1000ML POUR (IV SOLUTION) ×1 IMPLANT

## 2023-01-31 NOTE — Op Note (Signed)
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, progressive myelopathy  POSTOP DIAGNOSIS: Same  PROCEDURE: Posterior cervical bilateral laminectomy C3, C4, C5, C6, C7 for decompression of neural elements Posterior cervical lateral mass instrumentation and fusion, bilateral C3-4, C4-5, C5-6, C6/7, C7-T1 Lateral mass instrumentation, bilateral C3, C4, C5, C6: K2 M [place] 4.0 mm x 12 mm bilaterally at C3, 4.0 mm x 14 mm bilaterally at C4, C5, C6 Bilateral T1 pedicle screw instrumentation, K2 M [place] 4.5 mm x 26 mm bilaterally Intraoperative use of autograft, same incision Intraoperative use of allograft Intraoperative use of microscope for microdissection  SURGEON: Dr. Kendell Bane C. Sammantha Mehlhaff, DO  ASSISTANT: Dr. Hoyt Koch, MD  ANESTHESIA: General Endotracheal  EBL: 150 cc  SPECIMENS: None  DRAINS: Medium Hemovac, epidural space  COMPLICATIONS: None  CONDITION: Hemodynamically stable  HISTORY: Gabriel Cook is a 52 y.o. male with signs and symptoms of progressive myelopathy.  MRI revealed multifactorial stenosis C3-T1 with cord signal change.  Given his progressive symptoms of numbness tingling and balance difficulty as well as his physical exam findings consistent with myelopathy, I recommended surgical intervention in the form of a posterior cervical laminectomy, instrumentation and fusion C3-T1.  We discussed all risks, benefits and expected outcomes.  Informed consent was obtained and witnessed.  Cardiac clearance was obtained.  PROCEDURE IN DETAIL: The patient was brought to the operating room. After induction of general anesthesia, the patient head was placed in a [place] head holder and was positioned on the operative table in the prone position. All pressure points were meticulously padded. Skin incision was then marked out and prepped and draped in  the usual sterile fashion. Physician driven timeout was performed.  Local anesthetic was injected into the planned incision.  Using 10 blade, incision was made sharply over the C3-T1 spinous processes.  Sharp dissection was performed down to the dorsal fascia.  Using Bovie electrocautery, subperiosteal dissection was performed bilaterally to expose the C3, C4, C5, C6, C7, T1 spinous processes and lateral masses/transverse processes.  Lateral fluoroscopy confirmed the appropriate level.  The microscope was sterilely draped and brought into the field for the decompression.  Using high-speed drill, I drilled bilateral troughs along the C3, C4, C5, C6, C7 lamina facet junction for decompression.  The ligamentous attachments were Kerrison rongeur and to disconnect the lamina and spinous processes.  This was elevated and removed and kept for autograft.  The epidural space was identified and hemostasis was achieved with Surgifoam.  Using Kerrison rongeurs, I further decompressed the lateral recesses bilaterally.  The thecal sac was widely decompressed and pulsatile.  Using a high-speed drill, I decorticated the lateral masses and facet joints at C3, C4, C5, C6, C7 and the C7-T1 joint.  Using high-speed drill, create a pilot hole in a superior lateral trajectory in the C3 lateral mass.  This was repeated at C4, C5, C6.  Using a 3.0 mm tap, the tap was used to a depth of approximately 12 mm.  Using a depth probe, I felt the C3 lateral masses bilaterally and a 12 mm screw was appropriate and therefore was placed with appropriate bony purchase.  This was repeated with 14 mm screws bilaterally at C4, C5, C6 with appropriate bony purchase.  I then created a pilot hole bilaterally to access the T1 pedicles with a high-speed drill.  Using a pedicle probe, I accessed the pedicles bilaterally.  Then  using progressively larger taps up to a 4.0 mm tap, the pedicles were tapped to a depth of 25 mm.  Using ball-tipped probe there  were bony borders in all directions superiorly inferiorly at the floor and laterally.  AP fluoroscopy confirmed the appropriate trajectory.  The above listed pedicle screws were placed with appropriate bony purchase.  A mixture of autograft and allograft was then placed in the lateral gutters bilaterally from C3-T1.  Appropriate size rods were then measured cut and contoured and placed in the tulips.  Setscrews were placed and final tightened to the manufacturer's recommendation.  Deep retractors were taken out of the wound.  Hemostasis was achieved with monopolar cautery and the soft tissues.  The wound was copiously irrigated and noted to be excellently hemostatic.  Vancomycin powder was placed deep in the wound.  Long-acting anesthetic was placed in the soft tissues.  Medium Hemovac was tunneled inferiorly and placed in the epidural space.  The wound was then closed in layers, 1 Vicryl suture for the muscle and fascia.  The dermis was then closed with 2-0 and 3-0 Vicryl suture.  Skin was closed with staples.  Sterile dressing was applied.  At the end of the case all sponge, needle, and instrument counts were correct. The patient was then transferred to the stretcher, Mayfield head holder removed, extubated, and taken to the post-anesthesia care unit in stable hemodynamic condition.

## 2023-01-31 NOTE — Anesthesia Procedure Notes (Signed)
Procedure Name: Intubation Date/Time: 01/31/2023 2:13 PM  Performed by: Earlene Plater, CRNAPre-anesthesia Checklist: Patient identified, Suction available, Emergency Drugs available, Patient being monitored and Timeout performed Patient Re-evaluated:Patient Re-evaluated prior to induction Oxygen Delivery Method: Circle system utilized Preoxygenation: Pre-oxygenation with 100% oxygen Induction Type: IV induction Ventilation: Mask ventilation without difficulty Laryngoscope Size: Glidescope and 4 Grade View: Grade I Tube type: Oral Tube size: 7.5 mm Number of attempts: 1 Airway Equipment and Method: Stylet, Bite block and Video-laryngoscopy Placement Confirmation: ETT inserted through vocal cords under direct vision, positive ETCO2, CO2 detector and breath sounds checked- equal and bilateral Secured at: 24 (@ lips; Confirmed by Dr. Desmond Lope) cm Tube secured with: Tape Dental Injury: Teeth and Oropharynx as per pre-operative assessment

## 2023-01-31 NOTE — Anesthesia Postprocedure Evaluation (Signed)
Anesthesia Post Note  Patient: Gabriel Cook  Procedure(s) Performed: POSTERIOR CERVICAL DECOMPRESSION, INSTRUMENTATION AND FUSION CERVICAL THREE-THORACIC ONE (Neck)     Patient location during evaluation: PACU Anesthesia Type: General Level of consciousness: awake and alert Pain management: pain level controlled Vital Signs Assessment: post-procedure vital signs reviewed and stable Respiratory status: spontaneous breathing, nonlabored ventilation, respiratory function stable and patient connected to nasal cannula oxygen Cardiovascular status: blood pressure returned to baseline and stable Postop Assessment: no apparent nausea or vomiting Anesthetic complications: no   No notable events documented.  Last Vitals:  Vitals:   01/31/23 1754 01/31/23 1809  BP: (!) 167/100 (!) 169/100  Pulse: 87 72  Resp: 19 11  Temp: 36.5 C   SpO2: 100% 95%    Last Pain:  Vitals:   01/31/23 1754  TempSrc:   PainSc: 0-No pain                 Collene Schlichter

## 2023-01-31 NOTE — H&P (Signed)
Providing Compassionate, Quality Care - Together  NEUROSURGERY HISTORY & PHYSICAL   Gabriel Cook is an 52 y.o. male.   Chief Complaint: Numbness and tingling, difficulty walking HPI: This is a 52 year old male with progressively worsening numbness tingling in all extremities as well as difficulty walking for many months.  He was evaluated and found to have severe cervical stenosis from C3-C7 with evidence of cord signal change.  Given his signs and symptoms of progressive myelopathy, I recommended surgical intervention in the form of a posterior cervical decompression and instrumentation and fusion C3-T1.  He underwent cardiac evaluation and clearance.  Past Medical History:  Diagnosis Date   Cataract    Diabetes mellitus without complication (HCC) 2018   Hypertension     Past Surgical History:  Procedure Laterality Date   CATARACT EXTRACTION Left    CATARACT EXTRACTION Right    2022    Family History  Problem Relation Age of Onset   Cancer Mother        ?   Diabetes Mother    Hypertension Mother    Diabetes Father    Heart disease Neg Hx    Stroke Neg Hx    Social History:  reports that he has been smoking cigarettes. He has a 7.5 pack-year smoking history. He has never used smokeless tobacco. He reports current alcohol use of about 12.0 - 16.0 standard drinks of alcohol per week. He reports that he does not currently use drugs.  Allergies: No Known Allergies  No medications prior to admission.    Results for orders placed or performed during the hospital encounter of 01/31/23 (from the past 48 hour(s))  Glucose, capillary     Status: Abnormal   Collection Time: 01/31/23 11:06 AM  Result Value Ref Range   Glucose-Capillary 119 (H) 70 - 99 mg/dL    Comment: Glucose reference range applies only to samples taken after fasting for at least 8 hours.  Type and screen     Status: None (Preliminary result)   Collection Time: 01/31/23 12:23 PM  Result Value Ref Range    ABO/RH(D) PENDING    Antibody Screen PENDING    Sample Expiration      02/03/2023,2359 Performed at [facility], [address] [place], [place] [address]   ABO/Rh     Status: None   Collection Time: 01/31/23 12:28 PM  Result Value Ref Range   ABO/RH(D)      O POS Performed at [facility], [address], [place], [place] [address]   CBC per protocol     Status: None   Collection Time: 01/31/23 12:33 PM  Result Value Ref Range   WBC 4.5 4.0 - 10.5 K/uL   RBC 5.20 4.22 - 5.81 MIL/uL   Hemoglobin 14.9 13.0 - 17.0 g/dL   HCT 09.8 11.9 - 14.7 %   MCV 89.4 80.0 - 100.0 fL   MCH 28.7 26.0 - 34.0 pg   MCHC 32.0 30.0 - 36.0 g/dL   RDW 82.9 56.2 - 13.0 %   Platelets 178 150 - 400 K/uL   nRBC 0.0 0.0 - 0.2 %    Comment: Performed at [facility], [address], [place], [place] [address]  Glucose, capillary     Status: Abnormal   Collection Time: 01/31/23  1:02 PM  Result Value Ref Range   Glucose-Capillary 110 (H) 70 - 99 mg/dL    Comment: Glucose reference range applies only to samples taken  after fasting for at least 8 hours.   No results found.  ROS All pertinent positives and negatives are listed in HPI above  Blood pressure (!) 174/95, pulse 62, temperature 97.6 F (36.4 C), temperature source Oral, resp. rate 17, height 6\' 2"  (1.88 m), weight 136.1 kg, SpO2 99%. Physical Exam  Awake alert oriented x 3, no acute distress PERRLA cranial nerves II through XII intact Bilateral upper/lower extremities 4/5 Deep tendon reflexes 3/4 throughout upper/lowers bilaterally Positive Hoffmann's bilaterally Speech fluent and appropriate, face symmetric   Assessment/Plan 52 year old male with  C3-T1 cervical spondylitic myelopathy  -OR today for C3-T1 posterior cervical decompression, instrumentation and fusion.  We discussed all risks, benefits and expected outcomes as well as alternatives to treatment.  Informed consent was obtained and witnessed.  I  answered all of his questions.  Cardiac clearance was obtained.  Thank you for allowing me to participate in this patient's care.  Please do not hesitate to call with questions or concerns.   Monia Pouch, DO Neurosurgeon [facility] [phone]

## 2023-01-31 NOTE — Transfer of Care (Signed)
Immediate Anesthesia Transfer of Care Note  Patient: Gabriel Cook  Procedure(s) Performed: POSTERIOR CERVICAL DECOMPRESSION, INSTRUMENTATION AND FUSION CERVICAL THREE-THORACIC ONE (Neck)  Patient Location: PACU  Anesthesia Type:General  Level of Consciousness: awake, alert , and oriented  Airway & Oxygen Therapy: Patient Spontanous Breathing and Patient connected to face mask oxygen  Post-op Assessment: Report given to RN and Post -op Vital signs reviewed and stable  Post vital signs: Reviewed and stable  Last Vitals:  Vitals Value Taken Time  BP 167/100 01/31/23 1754  Temp 36.5 C 01/31/23 1754  Pulse 76 01/31/23 1756  Resp 13 01/31/23 1756  SpO2 99 % 01/31/23 1756  Vitals shown include unfiled device data.  Last Pain:  Vitals:   01/31/23 1754  TempSrc:   PainSc: 0-No pain         Complications: No notable events documented.

## 2023-01-31 NOTE — Progress Notes (Signed)
Orthopedic Tech Progress Note Patient Details:  Gabriel Cook Dec 11, 1970 [id number]  Ortho Devices Type of Ortho Device: Aspen cervical collar Ortho Device/Splint Interventions: Avie Echevaria 01/31/2023, 6:20 PM

## 2023-02-01 LAB — GLUCOSE, CAPILLARY
Glucose-Capillary: 170 mg/dL — ABNORMAL HIGH (ref 70–99)
Glucose-Capillary: 207 mg/dL — ABNORMAL HIGH (ref 70–99)
Glucose-Capillary: 149 mg/dL — ABNORMAL HIGH (ref 70–99)

## 2023-02-01 NOTE — Plan of Care (Signed)
  Problem: Clinical Measurements: Goal: Ability to maintain clinical measurements within normal limits will improve Outcome: Progressing Goal: Cardiovascular complication will be avoided Outcome: Progressing   Problem: Activity: Goal: Risk for activity intolerance will decrease Outcome: Progressing   Problem: Elimination: Goal: Will not experience complications related to bowel motility Outcome: Progressing   Problem: Pain Managment: Goal: General experience of comfort will improve Outcome: Progressing   Problem: Education: Goal: Ability to verbalize activity precautions or restrictions will improve Outcome: Progressing Goal: Understanding of discharge needs will improve Outcome: Progressing   Problem: Activity: Goal: Ability to avoid complications of mobility impairment will improve Outcome: Progressing Goal: Ability to tolerate increased activity will improve Outcome: Progressing

## 2023-02-01 NOTE — Evaluation (Signed)
Physical Therapy Evaluation Patient Details Name: Gabriel Cook MRN: [id number] DOB: 04-Jul-1970 Today's Date: 02/01/2023  History of Present Illness  52 yo with progressively worsening numbness tingling in all extremities as well as difficulty walking s/p C3-T1 PCDF 10/3. PMH: DM, HTN  Clinical Impression  Patient is s/p above surgery presenting with functional limitations due to the deficits listed below (see PT Problem List). Previously independent and active until last Sept when pt noticed a progressive decline in strength and function. With multiple falls, pt has essentially become w/c bound; last steps taken 3 weeks ago for a quick transfer to CT scan. He has been using his mother's w/c at home, no longer getting into shower due to fear of falling. He notices some modest improvement in strength of his LEs today as he was able to perform a SLR on each side through a small range. Currently requires Mod assist +2 for transfer from bed and recliner including step pivot transfer with notable Lt knee instability and difficulty fully extending Lt knee due to pain. Pt with brief (<15sec) period of unresponsiveness after transfer to recliner. LEs elevated and pt reclined with BP recorded at 110/77. Patient will benefit from intensive inpatient follow up therapy, >3 hours/day. Patient will benefit from acute skilled PT to increase their independence and safety with mobility to facilitate discharge.         If plan is discharge home, recommend the following: Two people to help with walking and/or transfers;A lot of help with bathing/dressing/bathroom;Assistance with cooking/housework;Assist for transportation;Help with stairs or ramp for entrance     Equipment Recommendations  (TBD next venue of care; Likely RW, and W/c)  Recommendations for Other Services  Rehab consult    Functional Status Assessment Patient has had a recent decline in their functional status and demonstrates the ability to make  significant improvements in function in a reasonable and predictable amount of time.     Precautions / Restrictions Precautions Precautions: Cervical Precaution Booklet Issued: Yes (comment) Required Braces or Orthoses: Cervical Brace Cervical Brace: Hard collar;Other (comment) (on when OOB) Restrictions Weight Bearing Restrictions: No      Mobility  Bed Mobility Overal bed mobility: Needs Assistance Bed Mobility: Rolling, Sidelying to Sit Rolling: Contact guard assist Sidelying to sit: Min assist, HOB elevated, Used rails       General bed mobility comments: Min assist with VC for long roll technique, HOB elevated, used rail, slow to rise.    Transfers Overall transfer level: Needs assistance Equipment used: Rolling walker (2 wheels) Transfers: Sit to/from Stand, Bed to chair/wheelchair/BSC Sit to Stand: Mod assist, +2 physical assistance   Step pivot transfers: Mod assist, +2 physical assistance       General transfer comment: Took a few lateral steps along bed towards Rt followed by step pivot transfer to recliner with mod assist +2 for RW control, balance, weight shift, and heavy cues for sequencing. Lt knee unstable but able to support himself with BIL UE on RW to prevent buckling. Performed from bed initially. Followed up with sit to stand transfer from recliner with mod A +2 and cues to quickly lock knees while rising (this was better on second attempt.)    Ambulation/Gait             Pre-gait activities: Weight shift, static march; bil UE support.    Stairs            Wheelchair Mobility     Tilt Bed    Modified Rankin (Stroke  Patients Only)       Balance Overall balance assessment: History of Falls, Needs assistance Sitting-balance support: No upper extremity supported, Feet supported Sitting balance-Leahy Scale: Good     Standing balance support: Bilateral upper extremity supported Standing balance-Leahy Scale: Poor Standing balance  comment: BIL support                             Pertinent Vitals/Pain Pain Assessment Pain Assessment: 0-10 Pain Score: 5  Pain Location: neck discomfort; R hand - dorsum of hand;index finger Pain Descriptors / Indicators: Discomfort, Grimacing Pain Intervention(s): Monitored during session, Repositioned, Limited activity within patient's tolerance    Home Living Family/patient expects to be discharged to:: Private residence Living Arrangements: Alone Available Help at Discharge: Family;Available PRN/intermittently Type of Home: House Home Access: Ramped entrance       Home Layout: One level Home Equipment: None (was using his Mom's old equipment; RW, WC, sliding board - unlikely any of this appropriately sized.)      Prior Function Prior Level of Function : Needs assist             Mobility Comments: Frequent falls; Declining since Sept 2023, using W/c at this point, has not been able to take any steps for the past 3 weeks. ADLs Comments: LB ADL tasks were difficult; completing IADL tasks form wc level; daughter assisting as needed; bird bath, afraid of shower.     Extremity/Trunk Assessment   Upper Extremity Assessment Upper Extremity Assessment: Defer to OT evaluation    Lower Extremity Assessment Lower Extremity Assessment: Generalized weakness (Able to SLR against gravity briefly; not fully assessed due to period of unresponsiveness when testing. Reports SILT)    Cervical / Trunk Assessment Cervical / Trunk Assessment: Neck Surgery  Communication   Communication Communication: No apparent difficulties  Cognition Arousal: Alert Behavior During Therapy: WFL for tasks assessed/performed Overall Cognitive Status: Within Functional Limits for tasks assessed                                          General Comments General comments (skin integrity, edema, etc.): Period of unresponsiveness while sitting in chair after transfer, eyes  rolled back. < 15 seconds, pt reclined back feet elevated, RN notified. Pt able to respond again and states he felt sleepy. HR 75, BP in chair 110/77    Exercises     Assessment/Plan    PT Assessment Patient needs continued PT services  PT Problem List Decreased strength;Decreased range of motion;Decreased activity tolerance;Decreased balance;Decreased mobility;Decreased coordination;Decreased knowledge of use of DME;Decreased knowledge of precautions;Cardiopulmonary status limiting activity;Obesity;Impaired tone;Pain       PT Treatment Interventions DME instruction;Gait training;Functional mobility training;Therapeutic exercise;Therapeutic activities;Balance training;Neuromuscular re-education;Patient/family education;Wheelchair mobility training;Modalities    PT Goals (Current goals can be found in the Care Plan section)  Acute Rehab PT Goals Patient Stated Goal: Go to rehab, get stronger, return home, work. PT Goal Formulation: With patient Time For Goal Achievement: 02/15/23 Potential to Achieve Goals: Good    Frequency Min 1X/week     Co-evaluation PT/OT/SLP Co-Evaluation/Treatment: Yes Reason for Co-Treatment: Complexity of the patient's impairments (multi-system involvement);For patient/therapist safety;To address functional/ADL transfers PT goals addressed during session: Mobility/safety with mobility;Proper use of DME;Balance         AM-PAC PT "6 Clicks" Mobility  Outcome Measure Help needed turning from  your back to your side while in a flat bed without using bedrails?: A Little Help needed moving from lying on your back to sitting on the side of a flat bed without using bedrails?: A Little Help needed moving to and from a bed to a chair (including a wheelchair)?: A Lot Help needed standing up from a chair using your arms (e.g., wheelchair or bedside chair)?: A Lot Help needed to walk in hospital room?: Total Help needed climbing 3-5 steps with a railing? : Total 6  Click Score: 12    End of Session Equipment Utilized During Treatment: Gait belt;Cervical collar Activity Tolerance: Patient tolerated treatment well;Other (comment) (Episode of unresponsiveness, likely syncopal/orthostatic but could not test to confirm.) Patient left: in chair;with call bell/phone within reach;with chair alarm set;with nursing/sitter in room Nurse Communication: Mobility status;Other (comment) (BP; period of unresponsiveness) PT Visit Diagnosis: Unsteadiness on feet (R26.81);Repeated falls (R29.6);Muscle weakness (generalized) (M62.81);History of falling (Z91.81);Ataxic gait (R26.0);Difficulty in walking, not elsewhere classified (R26.2);Other symptoms and signs involving the nervous system (R29.898);Pain Pain - part of body:  (neck and dorsum of Rt hand)    Time: 1131-1209 PT Time Calculation (min) (ACUTE ONLY): 38 min   Charges:   PT Evaluation $PT Eval Moderate Complexity: 1 Mod   PT General Charges $$ ACUTE PT VISIT: 1 Visit         [name], PT, DPT [facility]  Rehabilitation Services Physical Therapist Office: [phone] Website: [email or web address]   [name] 02/01/2023, 1:00 PM

## 2023-02-01 NOTE — TOC Initial Note (Signed)
Transition of Care [facility]) - Initial/Assessment Note    Patient Details  Name: Gabriel Cook MRN: [id number] Date of Birth: 25-May-1970  Transition of [facility]) CM/SW Contact:    Kermit Balo, RN Phone Number: 02/01/2023, 1:41 PM  Clinical Narrative:                  Patient is from home alone. He has been using a wheelchair for activity. Pt states his daughter works and son lives in [place]. CM encouraged him to discuss assistance after a rehab stay.  Patient's daughter, friends or medicaid transportation provide transportation for him. Pt states he wasn't taking any medications prior to admission. Awaiting CIR evaluation. TOC following.  Expected Discharge Plan: IP Rehab Facility Barriers to Discharge: Continued Medical Work up   Patient Goals and CMS Choice   CMS Medicare.gov Compare Post Acute Care list provided to:: Patient Choice offered to / list presented to : Patient      Expected Discharge Plan and Services   Discharge Planning Services: CM Consult Post Acute Care Choice: IP Rehab Living arrangements for the past 2 months: Single Family Home                                      Prior Living Arrangements/Services Living arrangements for the past 2 months: Single Family Home Lives with:: Self Patient language and need for interpreter reviewed:: Yes Do you feel safe going back to the place where you live?: Yes          Current home services: DME (wheelchair) Criminal Activity/Legal Involvement Pertinent to Current Situation/Hospitalization: No - Comment as needed  Activities of Daily Living   ADL Screening (condition at time of admission) Independently performs ADLs?: Yes (appropriate for developmental age) Is the patient deaf or have difficulty hearing?: No Does the patient have difficulty seeing, even when wearing glasses/contacts?: No Does the patient have difficulty concentrating, remembering, or making decisions?: No  Permission  Sought/Granted                  Emotional Assessment Appearance:: Appears stated age Attitude/Demeanor/Rapport: Engaged Affect (typically observed): Accepting Orientation: : Oriented to Self, Oriented to Place, Oriented to  Time, Oriented to Situation   Psych Involvement: No (comment)  Admission diagnosis:  Cervical myelopathy (HCC) [G95.9] Patient Active Problem List   Diagnosis Date Noted   Cervical myelopathy (HCC) 01/31/2023   Type 2 diabetes mellitus with hyperglycemia, without long-term current use of insulin (HCC) 01/02/2023   Morbid obesity (HCC) 01/02/2023   Hypertensive heart disease without CHF 01/02/2023   History of alcohol abuse 01/02/2023   Essential hypertension 01/02/2023   Dyspnea on exertion 12/29/2022   Preoperative clearance 12/29/2022   Mixed hyperlipidemia 12/29/2022   BMI 39.0-39.9,adult 06/28/2022   Prediabetes 06/28/2022   Tobacco use 06/28/2022   Arthritis of both knees 06/28/2022   Alcohol use 06/28/2022   Paroxysmal atrial fibrillation (HCC) 06/30/2021   Encounter for health maintenance examination in adult 06/07/2021   Screening for prostate cancer 06/07/2021   Screen for colon cancer 06/07/2021   Screening for lung cancer 06/07/2021   Screening for heart disease 06/07/2021   Screen for STD (sexually transmitted disease) 06/07/2021   Need for Tdap vaccination 06/07/2021   Need for pneumococcal vaccination 06/07/2021   Vaccine counseling 06/07/2021   Other fatigue 06/07/2021   Decreased urine stream 06/07/2021   Erectile dysfunction 06/07/2021  Urinary hesitancy 06/07/2021   BMI 40.0-44.9, adult (HCC) 06/07/2021   Enlarged and hypertrophic nails 06/07/2021   Tinea cruris 06/07/2021   Uncontrolled type 2 diabetes mellitus with hyperglycemia (HCC) 09/08/2020   Smoker 09/08/2020   PCP:  Mliss Sax, MD Pharmacy:   [facility] [address], [place] - [address] [place]  [place] [address] Phone: [phone] Fax: [phone]     Social Determinants of Health (SDOH) Social History: SDOH Screenings   Depression (PHQ2-9): Low Risk  (06/28/2022)  Tobacco Use: High Risk (01/29/2023)   SDOH Interventions:     Readmission Risk Interventions     No data to display

## 2023-02-01 NOTE — Progress Notes (Incomplete)
Pt cervical dressing has increased draining/bleeding (half of dressing covered in blood). Pt fears risks of infection, but no orders to change dressing at this time. Sutures appear to be in tact and dressing although old drainage. Informed pt monitoring will continue overnight as it does not appear to actively be bleedoi to ensure it has not increased anymore overnight until dayshift provider is available. Markings have been made on dressing. Charge nurse Phil, RN notified and was told to monitor dressing status.

## 2023-02-01 NOTE — Progress Notes (Signed)
   Providing Compassionate, Quality Care - Together  NEUROSURGERY PROGRESS NOTE   S: No issues overnight.  O: EXAM:  BP (!) 161/89 (BP Location: Left Arm)   Pulse 78   Temp 98.3 F (36.8 C) (Oral)   Resp 20   Ht 6\' 2"  (1.88 m)   Wt 135.6 kg   SpO2 100%   BMI 38.38 kg/m   Awake, alert, oriented x 3 PERRL Speech fluent, appropriate  CNs grossly intact  4+/5 BUE/BLE  Hemovac in place   ASSESSMENT:  52 y.o. male with   C3-T1 cervical spondylotic myelopathy, postop day 1 PCDF C3-T1  PLAN: -PT/OT evaluation -Pain control -Leave Hemovac for today -Likely will need rehab    Thank you for allowing me to participate in this patient's care.  Please do not hesitate to call with questions or concerns.   Monia Pouch, DO Neurosurgeon [facility] [phone]

## 2023-02-01 NOTE — Progress Notes (Signed)
Pt cervical dressing has had increased draining/bleeding. Pt fears risks of infection, but no orders to change dressing at this time. Sutures appear to be intact and dressing although old drainage. Informed pt monitoring will continue overnight as it does not appear to have active bleeding to ensure no changes from now until dayshift provider is available. Markings have been made on dressing. Charge nurse Michele Mcalpine, RN notified.pt educated. Denies any further questions/concerns at this time.

## 2023-02-01 NOTE — Progress Notes (Signed)
Occupational Therapy Treatment Note  Pt given cervical handout precaution sheet and requesting to get back to bed and feels safer with therapy helping him. Improved ability to push to stand however requires mod A +2 for safety. Improved ability to stay in RW and shift feet to return to bed. Patient will benefit from intensive inpatient follow up therapy, >3 hours/day. Will continue to follow. Pt very appreciative.     02/01/23 1600  OT Visit Information  Last OT Received On 02/01/23  Assistance Needed +2  History of Present Illness 52 yo with progressively worsening numbness tingling in all extremities as well as difficulty walking s/p C3-T1 PCDF 10/3. PMH: DM, HTN  Precautions  Precautions Cervical  Precaution Booklet Issued Yes (comment)  Required Braces or Orthoses Cervical Brace  Cervical Brace Hard collar;Other (comment) (on when OOB; donn in sitting)  Pain Assessment  Pain Assessment 0-10  Pain Score 5  Pain Descriptors / Indicators Discomfort;Grimacing  Pain Intervention(s) Limited activity within patient's tolerance  Cognition  Arousal Alert  Behavior During Therapy WFL for tasks assessed/performed  Overall Cognitive Status Within Functional Limits for tasks assessed  Upper Extremity Assessment  Upper Extremity Assessment LUE deficits/detail;RUE deficits/detail  Bed Mobility  Overal bed mobility Needs Assistance  Bed Mobility Rolling;Sit to Sidelying  Rolling Supervision  Sit to sidelying Mod assist (A to lift BLE onto bed)  Transfers  Overall transfer level Needs assistance  Equipment used Rolling walker (2 wheels)  Transfers Sit to/from Stand;Bed to chair/wheelchair/BSC  Sit to Stand Mod assist;+2 safety/equipment  Bed to/from chair/wheelchair/BSC transfer type: Step pivot  Step pivot transfers +2 physical assistance;Mod assist  General transfer comment Better stepping this pm; better control of descent into bed howeer requires mod A to prevent "falling" into bed   Balance  Sitting balance-Leahy Scale Good  Standing balance-Leahy Scale Poor  General Comments  General comments (skin integrity, edema, etc.) continues to complain of pain dorsal surface of hand @ index finger/back of hand  Exercises  Exercises Other exercises  Other Exercises  Other Exercises encouraged use of green foam cube for pinch and grip strengthening  OT - End of Session  Equipment Utilized During Treatment Gait belt;Rolling walker (2 wheels)  Activity Tolerance Patient tolerated treatment well  Patient left in bed;with call bell/phone within reach;with bed alarm set  Nurse Communication Mobility status;Precautions  OT Assessment/Plan  OT Visit Diagnosis Unsteadiness on feet (R26.81);Other abnormalities of gait and mobility (R26.89);Repeated falls (R29.6);Muscle weakness (generalized) (M62.81);Pain  Pain - Right/Left Right  Pain - part of body Hand (neck)  OT Frequency (ACUTE ONLY) Min 1X/week  Recommendations for Other Services Rehab consult  Follow Up Recommendations Acute inpatient rehab (3hours/day)  Patient can return home with the following A lot of help with walking and/or transfers;A lot of help with bathing/dressing/bathroom;Assistance with cooking/housework;Assist for transportation;Help with stairs or ramp for entrance  OT Equipment BSC/3in1;Wheelchair (measurements OT);Wheelchair cushion (measurements OT)  AM-PAC OT "6 Clicks" Daily Activity Outcome Measure (Version 2)  Help from another person eating meals? 3  Help from another person taking care of personal grooming? 3  Help from another person toileting, which includes using toliet, bedpan, or urinal? 2  Help from another person bathing (including washing, rinsing, drying)? 2  Help from another person to put on and taking off regular upper body clothing? 3  Help from another person to put on and taking off regular lower body clothing? 2  6 Click Score 15  Progressive Mobility  What is the highest  level of  mobility based on the progressive mobility assessment? Level 4 (Walks with assist in room) - Balance while marching in place and cannot step forward and back - Complete  Mobility Referral No  Activity Transferred from chair to bed  OT Goal Progression  Progress towards OT goals Progressing toward goals  Acute Rehab OT Goals  Patient Stated Goal to get better  OT Goal Formulation With patient  Time For Goal Achievement 02/15/23  Potential to Achieve Goals Good  ADL Goals  Pt Will Perform Upper Body Bathing with set-up;sitting  Pt Will Perform Lower Body Bathing with min assist;sit to/from stand;with adaptive equipment  Pt Will Perform Upper Body Dressing with set-up;with supervision;sitting  Pt Will Transfer to Toilet with min assist;bedside commode;stand pivot transfer  Additional ADL Goal #1 Pt will independently direct staff on donning/doffing cervical brace  OT Time Calculation  OT Start Time (ACUTE ONLY) 1545  OT Stop Time (ACUTE ONLY) 1605  OT Time Calculation (min) 20 min  OT General Charges  $OT Visit 1 Visit  OT Treatments  $Self Care/Home Management  8-22 mins  Luisa Dago, OT/L   Acute OT Clinical Specialist Acute Rehabilitation Services Pager [phone] Office [phone]

## 2023-02-01 NOTE — Plan of Care (Signed)

## 2023-02-01 NOTE — Evaluation (Addendum)
Occupational Therapy Evaluation Patient Details Name: Gabriel Cook MRN: [id number] DOB: 06-Dec-1970 Today's Date: 02/01/2023   History of Present Illness 52 yo with progressively worsening numbness tingling in all extremities as well as difficulty walking s/p C3-T1 PCDF 10/3. PMH: DM, HTN   Clinical Impression   PTA pt lives alone independently, however for the last 3 weeks he has been using a wc and requiring increased assistance at times with IADL tasks due to BLE weakness and frequent falls. Currently requires mod A +2 with limited mobility to the recliner and max A with LB ADL tasks. Patient will benefit from intensive inpatient follow up therapy, >3 hours/day to maximize functional level of independence with mobility and ADL tasks to facilitate safe DC home. . Acute OT to follow. After transferring to recliner, pt demonstrated a brief moment and reduced responsiveness/questionable syncopal event. Pt reclined. Systolic BP110; last recorded BP prior to mobility 170s. Nsg aware and attending to pt.       If plan is discharge home, recommend the following: A lot of help with walking and/or transfers;A lot of help with bathing/dressing/bathroom;Assistance with cooking/housework;Assist for transportation;Help with stairs or ramp for entrance    Functional Status Assessment  Patient has had a recent decline in their functional status and demonstrates the ability to make significant improvements in function in a reasonable and predictable amount of time.  Equipment Recommendations  BSC/3in1;Wheelchair (measurements OT);Wheelchair cushion (measurements OT)    Recommendations for Other Services Rehab consult     Precautions / Restrictions Precautions Precautions: Cervical Precaution Booklet Issued: Yes (comment) Required Braces or Orthoses: Cervical Brace Cervical Brace: Hard collar;Other (comment) (on when OOB) Restrictions Weight Bearing Restrictions: No      Mobility Bed  Mobility Overal bed mobility: Needs Assistance Bed Mobility: Rolling, Sidelying to Sit Rolling: Contact guard assist Sidelying to sit: Min assist, HOB elevated, Used rails            Transfers Overall transfer level: Needs assistance Equipment used: Rolling walker (2 wheels) Transfers: Sit to/from Stand, Bed to chair/wheelchair/BSC Sit to Stand: Mod assist, +2 physical assistance     Step pivot transfers: Mod assist, +2 physical assistance     General transfer comment: L knee weaker; unable to fully straighten; buckling at times; heavy use of BUE on RW      Balance Overall balance assessment: History of Falls, Needs assistance   Sitting balance-Leahy Scale: Good       Standing balance-Leahy Scale: Poor                             ADL either performed or assessed with clinical judgement   ADL Overall ADL's : Needs assistance/impaired Eating/Feeding: Set up   Grooming: Set up;Supervision/safety;Sitting   Upper Body Bathing: Minimal assistance;Sitting   Lower Body Bathing: Moderate assistance;Sit to/from stand   Upper Body Dressing : Minimal assistance;Sitting   Lower Body Dressing: Maximal assistance;Sit to/from stand   Toilet Transfer: Moderate assistance;+2 for physical assistance   Toileting- Clothing Manipulation and Hygiene: Moderate assistance       Functional mobility during ADLs: Moderate assistance;+2 for physical assistance;Rolling walker (2 wheels);Cueing for safety;Cueing for sequencing General ADL Comments: began educating on cervical precautions; will benefit fomr AE     Vision Baseline Vision/History: 4 Cataracts Ability to See in Adequate Light: 1 Impaired Patient Visual Report:  (blind L eye)       Perception  Praxis         Pertinent Vitals/Pain Pain Assessment Pain Assessment: 0-10 Pain Score: 5  Pain Location: neck discomfo0rt; R hand - dorsum of hand;index finger Pain Descriptors / Indicators:  Discomfort, Grimacing Pain Intervention(s): Limited activity within patient's tolerance, Patient requesting pain meds-RN notified     Extremity/Trunk Assessment Upper Extremity Assessment Upper Extremity Assessment: generalized weakness  LUE deficits/detail;RUE deficits/detail; BUE AROM WFL with overall 3+/5 throughout; mild decreased intrinsic strength with mild clawing noted - functional use of B hands without difficulty; heavy reliance on BUE for transfers; post op pain in neck; mild in shoulders; numbness/pain dorsum of hand distal to index finger Lower Extremity Assessment Lower Extremity Assessment: Generalized weakness (Able to SLR against gravity briefly; not fully assessed due to period of unresponsiveness when testing. Reports SILT)   Cervical / Trunk Assessment Cervical / Trunk Assessment: Neck Surgery   Communication Communication Communication: No apparent difficulties   Cognition Arousal: Alert Behavior During Therapy: WFL for tasks assessed/performed Overall Cognitive Status: Within Functional Limits for tasks assessed                                       General Comments  Period of unresponsiveness while sitting in chair after transfer, eyes rolled back. < 15 seconds, pt reclined back feet elevated, RN notified. Pt able to respond again and states he felt sleepy. HR 75, BP in chair 110/77    Exercises     Shoulder Instructions      Home Living Family/patient expects to be discharged to:: Private residence Living Arrangements: Alone Available Help at Discharge: Family;Available PRN/intermittently Type of Home: House Home Access: Ramped entrance     Home Layout: One level     Bathroom Shower/Tub: Tub/shower unit;Walk-in shower     Bathroom Accessibility: Yes ("Mostly")   Home Equipment: None (was using his Mom's old equipment; RW, WC, sliding board - unlikely any of this appropriately sized.)          Prior Functioning/Environment  Prior Level of Function : Needs assist             Mobility Comments: Frequent falls; Declining since Sept 2023, using W/c at this point, has not been able to take any steps for the past 3 weeks. ADLs Comments: LB ADL tasks were difficult; completing IADL tasks form wc level; daughter assisting as needed; bird bath, afraid of shower.        OT Problem List: Decreased strength;Decreased range of motion;Decreased activity tolerance;Impaired balance (sitting and/or standing);Decreased coordination;Decreased safety awareness;Decreased knowledge of use of DME or AE;Decreased knowledge of precautions;Obesity;Impaired UE functional use;Pain      OT Treatment/Interventions: Self-care/ADL training;Therapeutic exercise;Neuromuscular education;DME and/or AE instruction;Therapeutic activities;Patient/family education;Balance training    OT Goals(Current goals can be found in the care plan section) Acute Rehab OT Goals Patient Stated Goal: to stand and walk OT Goal Formulation: With patient Time For Goal Achievement: 02/15/23 Potential to Achieve Goals: Good  OT Frequency: Min 1X/week    Co-evaluation   Reason for Co-Treatment: Complexity of the patient's impairments (multi-system involvement);For patient/therapist safety;To address functional/ADL transfers PT goals addressed during session: Mobility/safety with mobility;Proper use of DME;Balance        AM-PAC OT "6 Clicks" Daily Activity     Outcome Measure Help from another person eating meals?: A Little Help from another person taking care of personal grooming?: A Little Help from another person  toileting, which includes using toliet, bedpan, or urinal?: A Lot Help from another person bathing (including washing, rinsing, drying)?: A Lot Help from another person to put on and taking off regular upper body clothing?: A Little Help from another person to put on and taking off regular lower body clothing?: A Lot 6 Click Score: 15   End of  Session Equipment Utilized During Treatment: Gait belt;Rolling walker (2 wheels) Nurse Communication: Mobility status;Other (comment) (brief moment of decreased resonsiveness; ?orthostatic)  Activity Tolerance: Patient tolerated treatment well Patient left: in chair;with call bell/phone within reach;with chair alarm set;with nursing/sitter in room  OT Visit Diagnosis: Unsteadiness on feet (R26.81);Other abnormalities of gait and mobility (R26.89);Repeated falls (R29.6);Muscle weakness (generalized) (M62.81);Pain Pain - Right/Left: Right Pain - part of body: Hand (neck)                Time: 7829-5621 OT Time Calculation (min): 47 min Charges:  OT General Charges $OT Visit: 1 Visit OT Evaluation $OT Eval Moderate Complexity: 1 Mod OT Treatments $Self Care/Home Management : 8-22 mins  Luisa Dago, OT/L   Acute OT Clinical Specialist Acute Rehabilitation Services Pager [phone] Office [phone]   [facility] 02/01/2023, 1:14 PM

## 2023-02-02 LAB — GLUCOSE, CAPILLARY: Glucose-Capillary: 174 mg/dL — ABNORMAL HIGH (ref 70–99)

## 2023-02-02 NOTE — Progress Notes (Signed)
Pt blood sugar has been greater than 140 and does not have glycemic control sets ordered.

## 2023-02-02 NOTE — Progress Notes (Signed)
Physical Therapy Treatment Patient Details Name: Gabriel Cook MRN: [id number] DOB: [birth year] Today's Date: 02/02/2023   History of Present Illness 52 yo with progressively worsening numbness tingling in all extremities as well as difficulty walking s/p C3-T1 PCDF 10/3. PMH: DM, HTN    PT Comments  Pt is slowly progressing towards goals. Pt was limited today in session due to lightheadedness and increased respirations after standing and attempting steps. Pt is Mod a for bed mobility and Mod A +2 for sit to stand (Total). Pt was able to take a few steps forward and then to the R with cueing for wgt shifting and sequencing to facilitate pre-gait activities. Due to pt current functional status, home set up and available assistance at home recommending skilled physical therapy services  > 3 hours/day on discharge from acute care hospital setting in order to decrease risk for falls, injury, immobility, re-hospitalization and return to age related activities.    If plan is discharge home, recommend the following: Two people to help with walking and/or transfers;Assistance with cooking/housework;Assist for transportation;Help with stairs or ramp for entrance     Equipment Recommendations  Other (comment) (defer to post acute)       Precautions / Restrictions Precautions Precautions: Cervical Precaution Comments: reviewed cervical precautions during session with mobility Required Braces or Orthoses: Cervical Brace Cervical Brace: Hard collar;Other (comment) (on when OOB, don in sitting) Restrictions Other Position/Activity Restrictions: cervical restrictions     Mobility  Bed Mobility Overal bed mobility: Needs Assistance Bed Mobility: Rolling, Sit to Sidelying, Sidelying to Sit Rolling: Supervision Sidelying to sit: HOB elevated, Used rails, Mod assist     Sit to sidelying: Mod assist General bed mobility comments: Mod A to get trunk to mid line for supine to sitting and Mod A with bil  LE for sitting to supine.    Transfers Overall transfer level: Needs assistance Equipment used: Rolling walker (2 wheels) Transfers: Sit to/from Stand, Bed to chair/wheelchair/BSC Sit to Stand: Mod assist, +2 safety/equipment           General transfer comment: attempted standing 1x pt was able to stand for ~3 min at EOB    Ambulation/Gait   Pre-gait activities: Worked on wgt shifting and stepping forward/side. Pt then had buckling of bil knees and required 2+Max A to sit EOB         Balance Overall balance assessment: History of Falls, Needs assistance Sitting-balance support: No upper extremity supported, Feet supported Sitting balance-Leahy Scale: Good     Standing balance support: Bilateral upper extremity supported Standing balance-Leahy Scale: Poor Standing balance comment: BIL support from staff          Cognition Arousal: Alert Behavior During Therapy: WFL for tasks assessed/performed Overall Cognitive Status: Within Functional Limits for tasks assessed         General Comments General comments (skin integrity, edema, etc.): Pt states pain has gotten much better. He has a headache today he believes is from pain medication so has not taken as much today with less nerve pain.      Pertinent Vitals/Pain Pain Assessment Pain Score: 4  Pain Location: headache Pain Descriptors / Indicators: Aching Pain Intervention(s): Monitored during session, Limited activity within patient's tolerance     PT Goals (current goals can now be found in the care plan section) Acute Rehab PT Goals Patient Stated Goal: Go to rehab, get stronger, return home, work. PT Goal Formulation: With patient Time For Goal Achievement: 02/15/23 Potential to Achieve Goals: Good  Progress towards PT goals: Progressing toward goals    Frequency    Min 1X/week      PT Plan  Continue with current POC       AM-PAC PT "6 Clicks" Mobility   Outcome Measure  Help needed turning  from your back to your side while in a flat bed without using bedrails?: A Little Help needed moving from lying on your back to sitting on the side of a flat bed without using bedrails?: A Lot Help needed moving to and from a bed to a chair (including a wheelchair)?: Total Help needed standing up from a chair using your arms (e.g., wheelchair or bedside chair)?: Total Help needed to walk in hospital room?: Total Help needed climbing 3-5 steps with a railing? : Total 6 Click Score: 9    End of Session Equipment Utilized During Treatment: Gait belt;Cervical collar Activity Tolerance: Patient tolerated treatment well;Other (comment) (lightheadedness) Patient left: with bed alarm set;in bed;with call bell/phone within reach Nurse Communication: Mobility status PT Visit Diagnosis: Unsteadiness on feet (R26.81);Repeated falls (R29.6);Muscle weakness (generalized) (M62.81);History of falling (Z91.81);Ataxic gait (R26.0);Difficulty in walking, not elsewhere classified (R26.2);Other symptoms and signs involving the nervous system (R29.898);Pain Pain - part of body:  (head)     Time: 6578-4696 PT Time Calculation (min) (ACUTE ONLY): 26 min  Charges:    $Therapeutic Activity: 23-37 mins PT General Charges $$ ACUTE PT VISIT: 1 Visit                    Harrel Carina, DPT, CLT  Acute Rehabilitation Services Office: [phone] (Secure chat preferred)    Claudia Desanctis 02/02/2023, 1:37 PM

## 2023-02-02 NOTE — Progress Notes (Addendum)
No new issues or problems.  Patient still with some dysesthetic pain into his right upper extremity.  No new weakness.  Pain reasonably well-controlled overall.  Vital signs are stable.  He is afebrile.  Drain output is still moderate.  Patient with some esthesia in his distal right upper extremity.  Motor strength shows some stable spastic weakness in both upper and lower extremities wound clean and dry.  Dressing intact.  Abdomen soft.  Overall progressing reasonably well following multilevel posterior cervical decompression and fusion surgery.  Continue efforts at mobilization and therapy.  Continue drain for now.

## 2023-02-02 NOTE — Plan of Care (Signed)
  Problem: Clinical Measurements: Goal: Ability to maintain clinical measurements within normal limits will improve Outcome: Progressing Goal: Will remain free from infection Outcome: Progressing   Problem: Activity: Goal: Risk for activity intolerance will decrease Outcome: Progressing   Problem: Nutrition: Goal: Adequate nutrition will be maintained Outcome: Progressing   Problem: Pain Managment: Goal: General experience of comfort will improve Outcome: Progressing   Problem: Skin Integrity: Goal: Risk for impaired skin integrity will decrease Outcome: Progressing

## 2023-02-03 ENCOUNTER — Other Ambulatory Visit: Payer: Self-pay

## 2023-02-03 LAB — GLUCOSE, CAPILLARY
Glucose-Capillary: 171 mg/dL — ABNORMAL HIGH (ref 70–99)
Glucose-Capillary: 199 mg/dL — ABNORMAL HIGH (ref 70–99)

## 2023-02-03 NOTE — Progress Notes (Signed)
No new issues or problems.  Patient's neck pain better controlled.  His right upper extremity symptoms are improving.  He was able to be out of bed minimally yesterday.  He is afebrile.  His vital signs are stable.  His urine output is good.  Drain output is minimal.  Awake and alert.  Oriented and appropriate.  Motor examination reveals some mild grip and intrinsic strength right worse than left in both hands.  Lower extremity strength is good.  Wound clean and dry.  Overall progressing well following cervical decompression and fusion surgery.  Okay to remove drain.  Continue efforts at mobilization and rehab.

## 2023-02-03 NOTE — Progress Notes (Signed)
Inpatient Rehab Admissions:  Inpatient Rehab Consult received.  I met with patient at the bedside for rehabilitation assessment and to discuss goals and expectations of an inpatient rehab admission.  Discussed average length of stay, insurance authorization requirement and discharge home after completion of CIR. Also discussed lack of bed availability in CIR at this time. Discussed another rehab venue may need to be considered if pt will be medically ready in the next day or so. Pt acknowledged  understanding. Pt interested in pursuing CIR. Pt informed AC that he will intermittent support after d/c. Will continue to follow.  Signed: Wolfgang Phoenix, MS, CCC-SLP Admissions Coordinator [phone]

## 2023-02-03 NOTE — Plan of Care (Signed)
  Problem: Elimination: Goal: Will not experience complications related to bowel motility Outcome: Progressing   Problem: Pain Managment: Goal: General experience of comfort will improve Outcome: Progressing   Problem: Safety: Goal: Ability to remain free from injury will improve Outcome: Progressing   Problem: Skin Integrity: Goal: Risk for impaired skin integrity will decrease Outcome: Progressing   Problem: Activity: Goal: Ability to avoid complications of mobility impairment will improve Outcome: Progressing Goal: Ability to tolerate increased activity will improve Outcome: Progressing Goal: Will remain free from falls Outcome: Progressing

## 2023-02-03 NOTE — Progress Notes (Signed)
Inpatient Rehab Admissions Coordinator Note:   Per therapy patient was screened for CIR candidacy by Prosper Paff Luvenia Starch, CCC-SLP. At this time, pt appears to be a potential candidate for CIR. I will place an order for rehab consult for full assessment, per our protocol.  Please contact me any with questions.Wolfgang Phoenix, MS, CCC-SLP Admissions Coordinator [phone] 02/03/23 1:09 PM

## 2023-02-03 NOTE — Progress Notes (Signed)
Pt refused vitals and blood sugar checks on nights to minimize sleep disruptions.

## 2023-02-03 NOTE — Progress Notes (Signed)
Pt refusing vitals, bed change, bath, blood sugar checks at this time. Pt states he wants to be left alone so he can rest. Educated pt on purpose of things listed above and pt still refused care. States he only wants his night meds and to sleep. Pt also refused stool softener even though he's not had a bm in a few days.

## 2023-02-03 NOTE — Progress Notes (Signed)
Patient refused Vital Signs

## 2023-02-04 ENCOUNTER — Encounter (HOSPITAL_COMMUNITY): Payer: Self-pay | Admitting: Neurological Surgery

## 2023-02-04 LAB — GLUCOSE, CAPILLARY
Glucose-Capillary: 156 mg/dL — ABNORMAL HIGH (ref 70–99)
Glucose-Capillary: 177 mg/dL — ABNORMAL HIGH (ref 70–99)
Glucose-Capillary: 300 mg/dL — ABNORMAL HIGH (ref 70–99)

## 2023-02-04 MED ORDER — DEXAMETHASONE SODIUM PHOSPHATE 4 MG/ML IJ SOLN
4.0000 mg | Freq: Four times a day (QID) | INTRAMUSCULAR | Status: DC
  Administered 2023-02-04 – 2023-02-07 (×12): 4 mg via INTRAVENOUS
  Filled 2023-02-04 (×13): qty 1

## 2023-02-04 MED ORDER — HEPARIN SODIUM (PORCINE) 5000 UNIT/ML IJ SOLN
5000.0000 [IU] | Freq: Three times a day (TID) | INTRAMUSCULAR | Status: DC
  Administered 2023-02-04 – 2023-02-07 (×9): 5000 [IU] via SUBCUTANEOUS
  Filled 2023-02-04 (×9): qty 1

## 2023-02-04 NOTE — Progress Notes (Signed)
PT Cancellation Note  Patient Details Name: Gabriel Cook MRN: [id number] DOB: Jul 30, 1970   Cancelled Treatment:    Reason Eval/Treat Not Completed: Patient declined, no reason specified  Requests PT held until he receives cervical injection this afternoon due to shoulder pain. Will follow-up this afternoon as schedule permits.   [name], PT, DPT [facility]  Rehabilitation Services Physical Therapist Office: [phone] Website: [email or web address]  [name] 02/04/2023, 12:07 PM

## 2023-02-04 NOTE — TOC Progression Note (Signed)
Transition of Care [facility]) - Progression Note    Patient Details  Name: Gabriel Cook MRN: [id number] Date of Birth: 04/20/1971  Transition of Care [facility]) CM/SW Contact  Kermit Balo, RN Phone Number: 02/04/2023, 10:40 AM  Clinical Narrative:     CM met with the patient and inquired about another IR so he can begin his rehab hopefully sooner. He was interested in [facility]. CM has called Thurston Hole at [facility] and faxed her the referral.  TOC following.  Expected Discharge Plan: IP Rehab Facility Barriers to Discharge: Continued Medical Work up  Expected Discharge Plan and Services   Discharge Planning Services: CM Consult Post Acute Care Choice: IP Rehab Living arrangements for the past 2 months: Single Family Home                                       Social Determinants of Health (SDOH) Interventions SDOH Screenings   Food Insecurity: No Food Insecurity (02/03/2023)  Housing: Low Risk  (02/03/2023)  Transportation Needs: No Transportation Needs (02/03/2023)  Utilities: Not At Risk (02/03/2023)  Depression (PHQ2-9): Low Risk  (06/28/2022)  Tobacco Use: High Risk (01/29/2023)    Readmission Risk Interventions     No data to display

## 2023-02-04 NOTE — Progress Notes (Signed)
Inpatient Rehab Admissions Coordinator:  Saw pt at bedside. Discussed lack of bed availability in CIR. Pt acknowledged understanding and is agreeable to another IPR. TOC is aware and following.   Wolfgang Phoenix, MS, CCC-SLP Admissions Coordinator [phone]

## 2023-02-04 NOTE — Progress Notes (Signed)
   Providing Compassionate, Quality Care - Together  NEUROSURGERY PROGRESS NOTE   S: No issues overnight. Complains of BL shoulder weakness  O: EXAM:  BP (!) 155/88 (BP Location: Left Arm)   Pulse 67   Temp 97.7 F (36.5 C) (Oral)   Resp 18   Ht 6\' 2"  (1.88 m)   Wt 135.6 kg   SpO2 99%   BMI 38.38 kg/m   Awake, alert, oriented x3 PERRL Speech fluent, appropriate  CNs grossly intact  2/5 bilateral deltoids, 4-/5 otherwise BUE/BLE   ASSESSMENT:  52 y.o. male with   C3-T1 severe stenosis with spondylotic myelopathy  -Status post C3-T1 PCDF  PLAN: -CIR eval pending -Monitor deltoid palsies -Will place him on steroids and DVT prophylaxis    Thank you for allowing me to participate in this patient's care.  Please do not hesitate to call with questions or concerns.   Monia Pouch, DO Neurosurgeon [facility] [phone]

## 2023-02-04 NOTE — Progress Notes (Signed)
Physical Therapy Treatment Patient Details Name: Gabriel Cook MRN: [id number] DOB: [birth year] Today's Date: 02/04/2023   History of Present Illness 52 yo with progressively worsening numbness tingling in all extremities as well as difficulty walking s/p C3-T1 PCDF 10/3. PMH: DM, HTN    PT Comments  Utilized RW to stand a couple of times from elevated bed surface today (Mod A +2.) Difficulty fully extending knees to stabilize and due to UE weakness, not able to brace and tolerate standing as well. Pt unable to side step or pivot to recliner, requiring Stedy this date. Puts forth great effort. No dizziness today. Very happy to be OOB in recliner for a while. Patient will continue to benefit from skilled physical therapy services to further improve independence with functional mobility.      If plan is discharge home, recommend the following: Two people to help with walking and/or transfers;Assistance with cooking/housework;Assist for transportation;Help with stairs or ramp for entrance   Can travel by private vehicle        Equipment Recommendations  Other (comment) (defer to post acute)    Recommendations for Other Services Rehab consult     Precautions / Restrictions Precautions Precautions: Cervical Precaution Booklet Issued: Yes (comment) Precaution Comments: reviewed cervical precautions during session with mobility Required Braces or Orthoses: Cervical Brace Cervical Brace: Hard collar;Other (comment) (on when OOB, don in sitting) Restrictions Weight Bearing Restrictions: No Other Position/Activity Restrictions: cervical restrictions     Mobility  Bed Mobility Overal bed mobility: Needs Assistance Bed Mobility: Rolling, Sidelying to Sit Rolling: Supervision Sidelying to sit: HOB elevated, Used rails, Mod assist       General bed mobility comments: Able to roll onto Rt side and bring LEs out of bed, mod assist for trunk to rise.    Transfers Overall transfer  level: Needs assistance Equipment used: Rolling walker (2 wheels) Transfers: Sit to/from Stand, Bed to chair/wheelchair/BSC Sit to Stand: Mod assist, +2 safety/equipment, Via lift equipment, From elevated surface           General transfer comment: Sit to stand from elevated bed surface X2 today with mod assist +2. Difficulty fully extending knees to lock out this date. Assisted with UEs onto RW but able to sustain grip. LEs buckle. Unable to sidestep along bed. Opted for [facility] for transfer for pt safety. Utilized this device to stand x2, better power up, pulling with UEs through horizontal rail. Transfer via Lift Equipment: Stedy  Ambulation/Gait             Pre-gait activities: Weight shifting. Able to lift Lt foot, but when attempting to lift Rt foot, Lt knee buckled.     Stairs             Wheelchair Mobility     Tilt Bed    Modified Rankin (Stroke Patients Only)       Balance Overall balance assessment: History of Falls, Needs assistance Sitting-balance support: No upper extremity supported, Feet supported Sitting balance-Leahy Scale: Good     Standing balance support: Bilateral upper extremity supported Standing balance-Leahy Scale: Poor Standing balance comment: BIL support from staff                            Cognition Arousal: Alert Behavior During Therapy: WFL for tasks assessed/performed Overall Cognitive Status: Within Functional Limits for tasks assessed  Exercises      General Comments General comments (skin integrity, edema, etc.): Wound dressing appears to be sliding down, adjusted and covered, RN notified.      Pertinent Vitals/Pain Pain Assessment Pain Assessment: Faces Faces Pain Scale: Hurts a little bit Pain Location: operative site Pain Descriptors / Indicators: Aching, Operative site guarding Pain Intervention(s): Monitored during session, Repositioned     Home Living                          Prior Function            PT Goals (current goals can now be found in the care plan section) Acute Rehab PT Goals Patient Stated Goal: Go to rehab, get stronger, return home, work. PT Goal Formulation: With patient Time For Goal Achievement: 02/15/23 Potential to Achieve Goals: Good Progress towards PT goals: Progressing toward goals    Frequency    Min 1X/week      PT Plan      Co-evaluation              AM-PAC PT "6 Clicks" Mobility   Outcome Measure  Help needed turning from your back to your side while in a flat bed without using bedrails?: A Little Help needed moving from lying on your back to sitting on the side of a flat bed without using bedrails?: A Lot Help needed moving to and from a bed to a chair (including a wheelchair)?: Total Help needed standing up from a chair using your arms (e.g., wheelchair or bedside chair)?: Total Help needed to walk in hospital room?: Total Help needed climbing 3-5 steps with a railing? : Total 6 Click Score: 9    End of Session Equipment Utilized During Treatment: Gait belt;Cervical collar Activity Tolerance: Patient tolerated treatment well Patient left: with call bell/phone within reach;in chair;with chair alarm set;with SCD's reapplied Nurse Communication: Mobility status (operative wound dressing. Wound looks clean though.) PT Visit Diagnosis: Unsteadiness on feet (R26.81);Repeated falls (R29.6);Muscle weakness (generalized) (M62.81);History of falling (Z91.81);Ataxic gait (R26.0);Difficulty in walking, not elsewhere classified (R26.2);Other symptoms and signs involving the nervous system (R29.898);Pain Pain - part of body:  (neck)     Time: 4098-1191 PT Time Calculation (min) (ACUTE ONLY): 25 min  Charges:    $Therapeutic Activity: 23-37 mins PT General Charges $$ ACUTE PT VISIT: 1 Visit                     [name], PT, DPT [facility]   Rehabilitation Services Physical Therapist Office: [phone] Website: [email or web address]    [name] 02/04/2023, 3:06 PM

## 2023-02-05 LAB — HEMOGLOBIN A1C
Hgb A1c MFr Bld: 5.7 % — ABNORMAL HIGH (ref 4.8–5.6)
Mean Plasma Glucose: 116.89 mg/dL

## 2023-02-05 LAB — GLUCOSE, CAPILLARY
Glucose-Capillary: 249 mg/dL — ABNORMAL HIGH (ref 70–99)
Glucose-Capillary: 191 mg/dL — ABNORMAL HIGH (ref 70–99)
Glucose-Capillary: 204 mg/dL — ABNORMAL HIGH (ref 70–99)
Glucose-Capillary: 264 mg/dL — ABNORMAL HIGH (ref 70–99)

## 2023-02-05 MED ORDER — INSULIN ASPART 100 UNIT/ML IJ SOLN
0.0000 [IU] | Freq: Every day | INTRAMUSCULAR | Status: DC
  Administered 2023-02-05: 3 [IU] via SUBCUTANEOUS
  Administered 2023-02-06: 2 [IU] via SUBCUTANEOUS
  Administered 2023-02-07: 4 [IU] via SUBCUTANEOUS
  Administered 2023-02-08: 3 [IU] via SUBCUTANEOUS

## 2023-02-05 MED ORDER — INSULIN ASPART 100 UNIT/ML IJ SOLN
0.0000 [IU] | Freq: Three times a day (TID) | INTRAMUSCULAR | Status: DC
  Administered 2023-02-05: 7 [IU] via SUBCUTANEOUS
  Administered 2023-02-05: 4 [IU] via SUBCUTANEOUS
  Administered 2023-02-05: 7 [IU] via SUBCUTANEOUS
  Administered 2023-02-06: 4 [IU] via SUBCUTANEOUS
  Administered 2023-02-06 (×2): 11 [IU] via SUBCUTANEOUS
  Administered 2023-02-07 (×2): 7 [IU] via SUBCUTANEOUS
  Administered 2023-02-07: 4 [IU] via SUBCUTANEOUS
  Administered 2023-02-08: 11 [IU] via SUBCUTANEOUS
  Administered 2023-02-08 – 2023-02-09 (×3): 4 [IU] via SUBCUTANEOUS

## 2023-02-05 NOTE — Plan of Care (Signed)
  Problem: Health Behavior/Discharge Planning: Goal: Ability to manage health-related needs will improve Outcome: Progressing   Problem: Clinical Measurements: Goal: Ability to maintain clinical measurements within normal limits will improve Outcome: Progressing Goal: Will remain free from infection Outcome: Progressing   

## 2023-02-05 NOTE — Plan of Care (Signed)
  Problem: Education: Goal: Knowledge of General Education information will improve Description Including pain rating scale, medication(s)/side effects and non-pharmacologic comfort measures Outcome: Progressing   Problem: Health Behavior/Discharge Planning: Goal: Ability to manage health-related needs will improve Outcome: Progressing   Problem: Clinical Measurements: Goal: Cardiovascular complication will be avoided Outcome: Progressing   Problem: Nutrition: Goal: Adequate nutrition will be maintained Outcome: Progressing   

## 2023-02-05 NOTE — Progress Notes (Signed)
   Providing Compassionate, Quality Care - Together  NEUROSURGERY PROGRESS NOTE   S: No issues overnight.   O: EXAM:  BP (!) 146/80 (BP Location: Left Arm)   Pulse 63   Temp 99 F (37.2 C) (Oral)   Resp 18   Ht 6\' 2"  (1.88 m)   Wt 135.6 kg   SpO2 99%   BMI 38.38 kg/m   Awake, alert, oriented x3 PERRL Speech fluent, appropriate  CNs grossly intact  2/5 bilateral deltoids, 3/5 BL biceps, 4/5 otherwise BUE/BLE   ASSESSMENT:  52 y.o. male with   C3-T1 severe stenosis with spondylotic myelopathy   -Status post C3-T1 PCDF   PLAN: -CIR eval pending -Monitor deltoid palsies -continue steroids   Thank you for allowing me to participate in this patient's care.  Please do not hesitate to call with questions or concerns.   Monia Pouch, DO Neurosurgeon [facility] [phone]

## 2023-02-05 NOTE — NC FL2 (Signed)
MEDICAID FL2 LEVEL OF CARE FORM     IDENTIFICATION  Patient Name: Gabriel Cook Birthdate: [birth year] Sex: male Admission Date (Current Location): 01/31/2023  [facility] and [place] Number:  Producer, television/film/video and Address:  [facility] [place], [address] [place], [place] [address]      Provider Number: [id number]  Attending Physician Name and Address:  Dawley, Alan Mulder, DO  Relative Name and Phone Number:       Current Level of Care: Hospital Recommended Level of Care: Skilled Nursing Facility Prior Approval Number:    Date Approved/Denied:   PASRR Number: [id number]  Discharge Plan: SNF    Current Diagnoses: Patient Active Problem List   Diagnosis Date Noted   Cervical myelopathy (HCC) 01/31/2023   Type 2 diabetes mellitus with hyperglycemia, without long-term current use of insulin (HCC) 01/02/2023   Morbid obesity (HCC) 01/02/2023   Hypertensive heart disease without CHF 01/02/2023   History of alcohol abuse 01/02/2023   Essential hypertension 01/02/2023   Dyspnea on exertion 12/29/2022   Preoperative clearance 12/29/2022   Mixed hyperlipidemia 12/29/2022   BMI 39.0-39.9,adult 06/28/2022   Prediabetes 06/28/2022   Tobacco use 06/28/2022   Arthritis of both knees 06/28/2022   Alcohol use 06/28/2022   Paroxysmal atrial fibrillation (HCC) 06/30/2021   Encounter for health maintenance examination in adult 06/07/2021   Screening for prostate cancer 06/07/2021   Screen for colon cancer 06/07/2021   Screening for lung cancer 06/07/2021   Screening for heart disease 06/07/2021   Screen for STD (sexually transmitted disease) 06/07/2021   Need for Tdap vaccination 06/07/2021   Need for pneumococcal vaccination 06/07/2021   Vaccine counseling 06/07/2021   Other fatigue 06/07/2021   Decreased urine stream 06/07/2021   Erectile dysfunction 06/07/2021   Urinary hesitancy 06/07/2021   BMI 40.0-44.9, adult (HCC) 06/07/2021   Enlarged  and hypertrophic nails 06/07/2021   Tinea cruris 06/07/2021   Uncontrolled type 2 diabetes mellitus with hyperglycemia (HCC) 09/08/2020   Smoker 09/08/2020    Orientation RESPIRATION BLADDER Height & Weight     Self, Time, Situation, Place  Normal Continent Weight: 298 lb 15.1 oz (135.6 kg) Height:  6\' 2"  (188 cm)  BEHAVIORAL SYMPTOMS/MOOD NEUROLOGICAL BOWEL NUTRITION STATUS      Continent Diet (regular)  AMBULATORY STATUS COMMUNICATION OF NEEDS Skin   Extensive Assist Verbally Surgical wounds (closed neck, honeycomb dressing)                       Personal Care Assistance Level of Assistance  Bathing, Feeding, Dressing Bathing Assistance: Maximum assistance Feeding assistance: Limited assistance Dressing Assistance: Maximum assistance     Functional Limitations Info  Sight Sight Info: Impaired        SPECIAL CARE FACTORS FREQUENCY  PT (By licensed PT), OT (By licensed OT)     PT Frequency: 5x/wk OT Frequency: 5x/wk            Contractures Contractures Info: Not present    Additional Factors Info  Code Status, Allergies Code Status Info: Full Allergies Info: NKA           Current Medications (02/05/2023):  This is the current hospital active medication list Current Facility-Administered Medications  Medication Dose Route Frequency Provider Last Rate Last Admin   acetaminophen (TYLENOL) tablet 650 mg  650 mg Oral Q4H PRN Dawley, Troy C, DO   650 mg at 02/04/23 1005   Or   acetaminophen (TYLENOL) suppository 650 mg  650 mg Rectal Q4H PRN Dawley, Troy C, DO       dexamethasone (DECADRON) injection 4 mg  4 mg Intravenous Q6H Dawley, Troy C, DO   4 mg at 02/05/23 1144   docusate sodium (COLACE) capsule 100 mg  100 mg Oral BID Dawley, Troy C, DO   100 mg at 02/05/23 1036   heparin injection 5,000 Units  5,000 Units Subcutaneous Q8H Dawley, Troy C, DO   5,000 Units at 02/05/23 1341   HYDROmorphone (DILAUDID) injection 0.5 mg  0.5 mg Intravenous Q3H PRN  Dawley, Troy C, DO   0.5 mg at 02/04/23 1820   insulin aspart (novoLOG) injection 0-20 Units  0-20 Units Subcutaneous TID WC Meyran, Tiana Loft, NP   4 Units at 02/05/23 1144   insulin aspart (novoLOG) injection 0-5 Units  0-5 Units Subcutaneous QHS Meyran, Tiana Loft, NP       menthol-cetylpyridinium (CEPACOL) lozenge 3 mg  1 lozenge Oral PRN Dawley, Troy C, DO       Or   phenol (CHLORASEPTIC) mouth spray 1 spray  1 spray Mouth/Throat PRN Dawley, Troy C, DO       methocarbamol (ROBAXIN) tablet 500 mg  500 mg Oral Q6H PRN Dawley, Troy C, DO   500 mg at 02/05/23 1341   Or   methocarbamol (ROBAXIN) 500 mg in dextrose 5 % 50 mL IVPB  500 mg Intravenous Q6H PRN Dawley, Troy C, DO       ondansetron (ZOFRAN) tablet 4 mg  4 mg Oral Q6H PRN Dawley, Troy C, DO       Or   ondansetron (ZOFRAN) injection 4 mg  4 mg Intravenous Q6H PRN Dawley, Troy C, DO       oxyCODONE (Oxy IR/ROXICODONE) immediate release tablet 10 mg  10 mg Oral Q3H PRN Dawley, Troy C, DO   10 mg at 02/05/23 2956   oxyCODONE (Oxy IR/ROXICODONE) immediate release tablet 5 mg  5 mg Oral Q3H PRN Dawley, Troy C, DO   5 mg at 02/05/23 1341     Discharge Medications: Please see discharge summary for a list of discharge medications.  Relevant Imaging Results:  Relevant Lab Results:   Additional Information SS#[id number]  Baldemar Lenis, LCSW

## 2023-02-05 NOTE — TOC Progression Note (Signed)
Transition of Care [facility]) - Progression Note    Patient Details  Name: Gabriel Cook MRN: [id number] Date of Birth: 1970-11-05  Transition of Care [facility]) CM/SW Contact  Kermit Balo, RN Phone Number: 02/05/2023, 2:59 PM  Clinical Narrative:     [facility] will not offer a rehab bed as he doesn't have 24 hour supervision after rehab. [facility] said the same.  [facility] is not in network with his insurance.  Pt agreeable to SNF rehab in [facility] area.  LCSW updated.   Expected Discharge Plan: IP Rehab Facility Barriers to Discharge: Continued Medical Work up  Expected Discharge Plan and Services   Discharge Planning Services: CM Consult Post Acute Care Choice: IP Rehab Living arrangements for the past 2 months: Single Family Home                                       Social Determinants of Health (SDOH) Interventions SDOH Screenings   Food Insecurity: No Food Insecurity (02/03/2023)  Housing: Low Risk  (02/03/2023)  Transportation Needs: No Transportation Needs (02/03/2023)  Utilities: Not At Risk (02/03/2023)  Depression (PHQ2-9): Low Risk  (06/28/2022)  Tobacco Use: High Risk (01/29/2023)    Readmission Risk Interventions     No data to display

## 2023-02-05 NOTE — Progress Notes (Signed)
Occupational Therapy Treatment Patient Details Name: Gabriel Cook MRN: [id number] DOB: Nov 11, 1970 Today's Date: 02/05/2023   History of present illness 52 yo with progressively worsening numbness tingling in all extremities as well as difficulty walking s/p C3-T1 PCDF 10/3. PMH: DM, HTN   OT comments  Pt still demonstrates significant weakness in BUEs and LEs.  He needs max assist for AAROM shoulder flexion bilaterally with min to mod for elbow flexion.  He demonstrates elbow extension strength at 3/5 bilaterally with the RUE being slightly stronger.  Overall mod to max assist needed for self feeding.  Sit to stand and transfer to recliner via Stedy from elevated bed for safety requires total assist currently.  Recommend nursing use lift pad for all transfers at this time secondary to weakness. Feel pt will continue to benefit from acute care OT at this time in order to increase independence with basic ADL tasks, BUE strength and functional use, and transfers.  Feel patient will benefit from intensive inpatient follow up therapy, >3 hours/day.       If plan is discharge home, recommend the following:  A lot of help with bathing/dressing/bathroom;Assistance with cooking/housework;Assist for transportation;Help with stairs or ramp for entrance;Two people to help with walking and/or transfers;Two people to help with bathing/dressing/bathroom;Assistance with feeding   Equipment Recommendations  Other (comment) (TBD next venue of care)       Precautions / Restrictions Precautions Precautions: Cervical Required Braces or Orthoses: Cervical Brace Cervical Brace: Hard collar;Other (comment) (can be donned in sitting) Restrictions Weight Bearing Restrictions: No Other Position/Activity Restrictions: cervical restrictions       Mobility Bed Mobility Overal bed mobility: Needs Assistance Bed Mobility: Supine to Sit, Rolling Rolling: Min assist Sidelying to sit: Mod assist       General  bed mobility comments: Pt needed assist from therapist for horizontal adduction to roll to the right side and then needed assist with bringing trunk up to sitting.    Transfers Overall transfer level: Needs assistance Equipment used:  Antony Salmon) Transfers: Sit to/from Stand, Bed to chair/wheelchair/BSC Sit to Stand: Via lift equipment, Total assist Antony Salmon)           General transfer comment: Pt completed sit to stand X2 in the Stedy from elevated bed.  Completed side to side weight shifts in standing with the Stedy and two mini squats prior to transfer to the chair.     Balance Overall balance assessment: Needs assistance Sitting-balance support: Feet supported Sitting balance-Leahy Scale: Good       Standing balance-Leahy Scale: Poor Standing balance comment: Needs BUE support and therapist assist for standing.                           ADL either performed or assessed with clinical judgement   ADL Overall ADL's : Needs assistance/impaired                             Toileting- Clothing Manipulation and Hygiene: Maximal assistance;Sit to/from stand;Total assistance Toileting - Clothing Manipulation Details (indicate cue type and reason): peri cleaning in standing using the Stedy     Functional mobility during ADLs: Total assistance (sit to stand in the [place]) General ADL Comments: Worked in BUE AAROM exercises for shoulder flexion and elbow flexion and AROM exercises for shoulder extension in supine.  Positioned HOB approximately 45 degrees while completing 1 set of 12 reps elbow flexion AAROM bilaterally  with therapist applying light resistance on extension.  Transitioned to [facility] flat for work on [employer] shoulder flexion for 1 set of 10 reps and intervals of therapist placing him in 90 degrees shoulder flexion with elbow extension and having him try to hold it.  Mod assist to maintain at 90 degrees for approximately 30 second intervals.  Steady utilized for sit  to stand from elevated bed.  Toilet hygiene completed in standing and pt transferred over to the recliner for with [facility] for OOB tolerance.      Cognition Arousal: Alert Behavior During Therapy: WFL for tasks assessed/performed Overall Cognitive Status: Within Functional Limits for tasks assessed                                                     Pertinent Vitals/ Pain       Pain Assessment Pain Assessment: Faces Faces Pain Scale: Hurts a little bit Pain Location: neck Pain Descriptors / Indicators: Discomfort Pain Intervention(s): Limited activity within patient's tolerance, RN gave pain meds during session, Repositioned         Frequency  Min 1X/week        Progress Toward Goals  OT Goals(current goals can now be found in the care plan section)  Progress towards OT goals: Progressing toward goals  Acute Rehab OT Goals Patient Stated Goal: Pt wants to work and get stronger to be able to do for himself. OT Goal Formulation: With patient Time For Goal Achievement: 02/15/23 Potential to Achieve Goals: Good  Plan         AM-PAC OT "6 Clicks" Daily Activity     Outcome Measure   Help from another person eating meals?: A Lot Help from another person taking care of personal grooming?: A Lot Help from another person toileting, which includes using toliet, bedpan, or urinal?: Total Help from another person bathing (including washing, rinsing, drying)?: Total Help from another person to put on and taking off regular upper body clothing?: Total Help from another person to put on and taking off regular lower body clothing?: Total 6 Click Score: 8    End of Session Equipment Utilized During Treatment: Gait belt;Rolling walker (2 wheels);Other (comment) Antony Salmon)  OT Visit Diagnosis: Unsteadiness on feet (R26.81);Other abnormalities of gait and mobility (R26.89);Repeated falls (R29.6);Muscle weakness (generalized) (M62.81);Pain;Feeding difficulties  (R63.3) Pain - Right/Left:  (neck)   Activity Tolerance Patient tolerated treatment well   Patient Left with call bell/phone within reach;with chair alarm set;in chair   Nurse Communication Mobility status;Need for lift equipment        Time: 1338-1440 OT Time Calculation (min): 62 min  Charges: OT General Charges $OT Visit: 1 Visit OT Treatments $Self Care/Home Management : 23-37 mins $Therapeutic Exercise: 23-37 mins  Perrin Maltese, OTR/L Acute Rehabilitation Services  Office [phone] 02/05/2023

## 2023-02-06 LAB — GLUCOSE, CAPILLARY
Glucose-Capillary: 263 mg/dL — ABNORMAL HIGH (ref 70–99)
Glucose-Capillary: 171 mg/dL — ABNORMAL HIGH (ref 70–99)
Glucose-Capillary: 283 mg/dL — ABNORMAL HIGH (ref 70–99)
Glucose-Capillary: 229 mg/dL — ABNORMAL HIGH (ref 70–99)

## 2023-02-06 MED ORDER — POLYETHYLENE GLYCOL 3350 17 G PO PACK
17.0000 g | PACK | Freq: Every day | ORAL | Status: DC
  Administered 2023-02-07 – 2023-02-09 (×2): 17 g via ORAL
  Filled 2023-02-06 (×4): qty 1

## 2023-02-06 MED ORDER — FLEET ENEMA RE ENEM: 1.0000 | ENEMA | Freq: Every day | RECTAL | Status: DC | PRN

## 2023-02-06 NOTE — TOC Progression Note (Signed)
Transition of Care [facility]) - Progression Note    Patient Details  Name: Gabriel Cook MRN: [id number] Date of Birth: 1971-02-23  Transition of Care [facility]) CM/SW Contact  Baldemar Lenis, [place] Phone Number: 02/06/2023, 10:51 AM  Clinical Narrative:   Patient has no bed offers for SNF. CSW faxed out referral further.    Expected Discharge Plan: Skilled Nursing Facility Barriers to Discharge: Continued Medical Work up  Expected Discharge Plan and Services   Discharge Planning Services: CM Consult Post Acute Care Choice: IP Rehab Living arrangements for the past 2 months: Single Family Home                                       Social Determinants of Health (SDOH) Interventions SDOH Screenings   Food Insecurity: No Food Insecurity (02/03/2023)  Housing: Low Risk  (02/03/2023)  Transportation Needs: No Transportation Needs (02/03/2023)  Utilities: Not At Risk (02/03/2023)  Depression (PHQ2-9): Low Risk  (06/28/2022)  Tobacco Use: High Risk (01/29/2023)    Readmission Risk Interventions     No data to display

## 2023-02-06 NOTE — Plan of Care (Signed)
  Problem: Education: Goal: Knowledge of General Education information will improve Description Including pain rating scale, medication(s)/side effects and non-pharmacologic comfort measures Outcome: Progressing   Problem: Health Behavior/Discharge Planning: Goal: Ability to manage health-related needs will improve Outcome: Progressing   

## 2023-02-06 NOTE — Progress Notes (Signed)
    Providing Compassionate, Quality Care - Together   NEUROSURGERY PROGRESS NOTE     S: No issues overnight.    O: EXAM:  BP (!) 160/96 (BP Location: Left Wrist)   Pulse 71   Temp 97.7 F (36.5 C) (Oral)   Resp 18   Ht 6\' 2"  (1.88 m)   Wt 135.6 kg   SpO2 93%   BMI 38.38 kg/m     Awake, alert, oriented  Speech fluent, appropriate  SILTx4 Dressing c/d/I 4/5 BLE, distal BUEs and triceps. 3/5 biceps, 2/5 deltoids BUE   ASSESSMENT:  52 y.o. with   C3-T1 severe stenosis with spondylotic myelopathy   -Status post C3-T1 PCDF   PLAN: -Anticipate SNF for placement/dc planning. Pt agreeable. Pt appropriate for dc from NSGY standpoint.  -Will taper steroids upon dc -Cont therapies as tolerated -Call w/ questions/concerns.   Patrici Ranks, [facility]

## 2023-02-06 NOTE — Progress Notes (Signed)
Physical Therapy Treatment Patient Details Name: Gabriel Cook MRN: [id number] DOB: 10-02-1970 Today's Date: 02/06/2023   History of Present Illness 52 yo with progressively worsening numbness tingling in all extremities as well as difficulty walking s/p C3-T1 PCDF 10/3. PMH: DM, HTN    PT Comments  Eager to participate with therapy. He is greatly limited by UE strength limitations today. Now requires mod assist for bed mobility and up to max assist with Stedy use for sit to stand transfer. I believe during our initial evaluation he was reliant but capable of using UEs adequately to transfer from bed to reclining chair using a RW to pivot. However today, because of the additional UE weakness we can only safely transfer pt with [facility] device. Once standing, with Lt knee blocked on device, pt able to tolerate multiple WB strength and NMRE exercises for LEs. Puts forth great effort. PA notified of decline via secure chat.  Biceps now 2/5 with MMT. LEs seem to be at the same strength and function that we assessed during our evaluation. Denies sensory changes. Only pain complaint is dorsum of Rt hand (post-op) and Lt knee when weight bearing (which he reports as chronic from arthritic changes.) Will continue to benefit from acute rehabilitation during admission. Patient will benefit from intensive inpatient follow up therapy, >3 hours/day. Patient will continue to benefit from skilled physical therapy services to further improve independence with functional mobility.     If plan is discharge home, recommend the following: Two people to help with walking and/or transfers;Assistance with cooking/housework;Assist for transportation;Help with stairs or ramp for entrance   Can travel by private vehicle      no  Equipment Recommendations  Other (comment) (defer to post acute)    Recommendations for Other Services Rehab consult     Precautions / Restrictions Precautions Precautions:  Cervical Precaution Booklet Issued: Yes (comment) Precaution Comments: reviewed cervical precautions during session with mobility Required Braces or Orthoses: Cervical Brace Cervical Brace: Hard collar;Other (comment) (can be donned in sitting) Restrictions Weight Bearing Restrictions: No Other Position/Activity Restrictions: cervical restrictions     Mobility  Bed Mobility Overal bed mobility: Needs Assistance Bed Mobility: Rolling, Sidelying to Sit Rolling: Min assist Sidelying to sit: Mod assist       General bed mobility comments: Rolls to Rt side with min assist for LE and trunk control. Mod assist for trunk to rise from sideling position, difficulty with UE movement and use.    Transfers Overall transfer level: Needs assistance Equipment used: Ambulation equipment used Antony Salmon) Transfers: Sit to/from Stand, Bed to chair/wheelchair/BSC Sit to Stand: Max assist Antony Salmon)           General transfer comment: Sit to stand from bed with Max assist, requires assist to place hands on horiziontal rail of Stedy, and for boost with bil knee block from [place] pad. Mod assist to rise from Stedy paddles x3. Cues for technique throughout. Transfer via Lift Equipment: Stedy  Ambulation/Gait             Pre-gait activities: Weight shifting with bil knee block on Stedy (able to control Rt knee) Frequent buckling of LT knee with weight shift to left, unable to fully extend Lt knee.     Stairs             Wheelchair Mobility     Tilt Bed    Modified Rankin (Stroke Patients Only)       Balance Overall balance assessment: Needs assistance Sitting-balance support: Feet supported  Sitting balance-Leahy Scale: Good     Standing balance support: Bilateral upper extremity supported Standing balance-Leahy Scale: Poor Standing balance comment: Needs BUE support CGA from PT in Stedy                            Cognition Arousal: Alert Behavior During Therapy:  [facility] for tasks assessed/performed Overall Cognitive Status: Within Functional Limits for tasks assessed                                          Exercises Other Exercises Other Exercises: Tolerated standing exercises in 3 different periods with seated rest breaks between: Heel raises x10, Toe raises x10, mini squats (Rt side bias) x12, hip hinge x 10, Weight shift with heel lifts (requires Lt knee block)    General Comments General comments (skin integrity, edema, etc.): Biceps strength now 2/5 BIL; PA notified.      Pertinent Vitals/Pain Pain Assessment Pain Assessment: Faces Pain Score: 4  Pain Location: Dorsum of Rt hand, 2nd MCP region (Lt knee when standing) Pain Descriptors / Indicators: Sore Pain Intervention(s): Monitored during session, Repositioned    Home Living                          Prior Function            PT Goals (current goals can now be found in the care plan section) Acute Rehab PT Goals Patient Stated Goal: Go to rehab, get stronger, return home, work. PT Goal Formulation: With patient Time For Goal Achievement: 02/15/23 Potential to Achieve Goals: Good Progress towards PT goals: Progressing toward goals    Frequency    Min 1X/week      PT Plan      Co-evaluation              AM-PAC PT "6 Clicks" Mobility   Outcome Measure  Help needed turning from your back to your side while in a flat bed without using bedrails?: A Little Help needed moving from lying on your back to sitting on the side of a flat bed without using bedrails?: A Lot Help needed moving to and from a bed to a chair (including a wheelchair)?: Total Help needed standing up from a chair using your arms (e.g., wheelchair or bedside chair)?: Total Help needed to walk in hospital room?: Total Help needed climbing 3-5 steps with a railing? : Total 6 Click Score: 9    End of Session Equipment Utilized During Treatment: Gait belt;Cervical  collar Activity Tolerance: Patient tolerated treatment well Patient left: with call bell/phone within reach;in chair;with chair alarm set;with SCD's reapplied   PT Visit Diagnosis: Unsteadiness on feet (R26.81);Repeated falls (R29.6);Muscle weakness (generalized) (M62.81);History of falling (Z91.81);Ataxic gait (R26.0);Difficulty in walking, not elsewhere classified (R26.2);Other symptoms and signs involving the nervous system (R29.898);Pain Pain - Right/Left: Right Pain - part of body:  (hand)     Time: 0865-7846 PT Time Calculation (min) (ACUTE ONLY): 39 min  Charges:    $Therapeutic Activity: 23-37 mins $Neuromuscular Re-education: 8-22 mins PT General Charges $$ ACUTE PT VISIT: 1 Visit                     [name], PT, DPT [facility]  Rehabilitation Services Physical Therapist Office: [phone] Website: [email or web address]  [name] 02/06/2023, 3:56 PM

## 2023-02-07 ENCOUNTER — Inpatient Hospital Stay (HOSPITAL_COMMUNITY): Payer: Medicaid Other

## 2023-02-07 LAB — GLUCOSE, CAPILLARY
Glucose-Capillary: 178 mg/dL — ABNORMAL HIGH (ref 70–99)
Glucose-Capillary: 210 mg/dL — ABNORMAL HIGH (ref 70–99)
Glucose-Capillary: 212 mg/dL — ABNORMAL HIGH (ref 70–99)
Glucose-Capillary: 344 mg/dL — ABNORMAL HIGH (ref 70–99)

## 2023-02-07 MED ORDER — DEXAMETHASONE SODIUM PHOSPHATE 4 MG/ML IJ SOLN
4.0000 mg | Freq: Three times a day (TID) | INTRAMUSCULAR | Status: DC
  Administered 2023-02-07 (×2): 4 mg via INTRAVENOUS
  Filled 2023-02-07 (×2): qty 1

## 2023-02-07 MED ORDER — DICLOFENAC SODIUM 1 % EX GEL
2.0000 g | Freq: Four times a day (QID) | CUTANEOUS | Status: DC | PRN
  Administered 2023-02-07 – 2023-02-08 (×3): 2 g via TOPICAL
  Filled 2023-02-07: qty 100

## 2023-02-07 NOTE — Plan of Care (Signed)
  Problem: Education: Goal: Knowledge of General Education information will improve Description: Including pain rating scale, medication(s)/side effects and non-pharmacologic comfort measures Outcome: Progressing   Problem: Activity: Goal: Risk for activity intolerance will decrease Outcome: Progressing   Problem: Nutrition: Goal: Adequate nutrition will be maintained Outcome: Progressing   Problem: Elimination: Goal: Will not experience complications related to urinary retention Outcome: Progressing   Problem: Skin Integrity: Goal: Risk for impaired skin integrity will decrease Outcome: Progressing

## 2023-02-07 NOTE — Progress Notes (Addendum)
Patient off the unit for MRI  1300: Patient back to the unit

## 2023-02-07 NOTE — Progress Notes (Signed)
MRI reviewed.  I discussed the findings with the patient, no compressive seroma or hematoma.  Cord signal change remains overall stable compared to preoperative MRI.  Therefore we will continue to work with therapies.  I do not believe there is any drainable lesion.  Rehab pending.

## 2023-02-07 NOTE — Progress Notes (Signed)
   Providing Compassionate, Quality Care - Together  NEUROSURGERY PROGRESS NOTE   S: No issues overnight. Complains of LUE worsening weakness  O: EXAM:  BP (!) 168/93 (BP Location: Right Arm)   Pulse 67   Temp 97.9 F (36.6 C) (Oral)   Resp 18   Ht 6\' 2"  (1.88 m)   Wt 135.6 kg   SpO2 95%   BMI 38.38 kg/m   Awake, alert, oriented x3 PERRL Speech fluent, appropriate  CNs grossly intact  RUE delt 2/5, bi 3/5, grip/tri 4/5 FE 4/5 LUE delt 1/5, bi/tri 2/5, FE 3/5, grip 3/5 4+/5 BLE   ASSESSMENT:  52 y.o. male with   C3-T1 CSM, s/p PCDF C3-T1    PLAN: - MRI C spine stat for eval of hematoma/compressive lesion given progressive weakness this am -pain control -voltaren gel for shoulder pain -npo    Thank you for allowing me to participate in this patient's care.  Please do not hesitate to call with questions or concerns.   Monia Pouch, DO Neurosurgeon [facility] [phone]

## 2023-02-07 NOTE — Progress Notes (Signed)
Occupational Therapy Treatment Patient Details Name: Gabriel Cook MRN: [id number] DOB: 1970/06/23 Today's Date: 02/07/2023   History of present illness 52 yo with progressively worsening numbness tingling in all extremities as well as difficulty walking s/p C3-T1 PCDF 10/3. PMH: DM, HTN   OT comments  Significant functional change in status due to inability to use BUE. RUE overall stronger than L, however pt is not able to move either shoulder or complete elbow flexion and is now total A for ADL tasks. Discussed concerns in functional change with Dr Dawley this am while he visited pt during session. Pt with B shoulder pain - please keep BUE supported at all times on pillows; voltaren get may help reduce pain. Will order a resting hand splint to be worn at night for L hand. Plan to further assess use of AE to help increase ability for [facility] to have access to fluids. Pt will benefit from intensive postacute rehab > 3 hrs/day. OT will continue to follow.       If plan is discharge home, recommend the following:  Two people to help with walking and/or transfers;A lot of help with bathing/dressing/bathroom;Assistance with cooking/housework;Assistance with feeding;Direct supervision/assist for medications management;Direct supervision/assist for financial management;Assist for transportation;Help with stairs or ramp for entrance   Equipment Recommendations  Wheelchair (measurements OT);Wheelchair cushion (measurements OT)    Recommendations for Other Services Rehab consult    Precautions / Restrictions Precautions Precautions: Cervical Precaution Booklet Issued: Yes (comment) Precaution Comments: reviewed cervical precautions during session with mobility Required Braces or Orthoses: Cervical Brace Cervical Brace: Hard collar;Other (comment) (donn is sitting; on when OOB)       Mobility Bed Mobility     Rolling: Mod assist    Able to assist with scooting up in bed           Transfers                   General transfer comment: not addressed this session     Balance                                           ADL either performed or assessed with clinical judgement   ADL   Eating/Feeding: Total assistance   Grooming: Total assistance   Upper Body Bathing: Total assistance   Lower Body Bathing: Total assistance   Upper Body Dressing : Total assistance   Lower Body Dressing: Total assistance               Functional mobility during ADLs: Moderate assistance (for bed mobility) General ADL Comments: Will benefit from long arm support and AE Keep call bell close to R hand    Extremity/Trunk Assessment Upper Extremity Assessment Upper Extremity Assessment: RUE deficits/detail;LUE deficits/detail RUE Deficits / Details: significant functional change, especially elbow/shoulder: hand 4+/5 with good intrinsic strength - using functionally; pain @ index finger/dorsum of hand is better howe er complains that index finger and thumb are numb; wrist flex/ext 4+/5; supination/pronation 3/5; elbow fexion 2-/5 ext 4/5; shoulder flex 2/5 abd 2-/5 ext2/5 scap elevation 4/5 depresison4/5 RUE Sensation: decreased light touch RUE Coordination: decreased gross motor LUE Deficits / Details: dominant arm; weaker than right - more involvement in wrist/finger extension. hand - gross grasp 3+/5; only able to oppose thumb to index finger; unable to extend digits - lacks @ 45 degrees extension index and middle; unable  to extend ring and little at all; wrist flex 3/5 extension 2+/5;supination 2/5 pronation 2+/5; elbow flexion2/5; extension 2+/5; shoulder flexion/abduction 1/5; shoulder elevation 3/5; dpression 2/5  May benefit from  dynamic L hand splint to assist with extension or use of K tape - IV currently interfering with ability to tape Lower Extremity Assessment Lower Extremity Assessment: Defer to PT evaluation        Vision        Perception     Praxis      Cognition Arousal: Alert Behavior During Therapy: [facility] for tasks assessed/performed Overall Cognitive Status: Within Functional Limits for tasks assessed                                          Exercises Exercises: Hand exercises, General Upper Extremity, Other exercises General Exercises - Upper Extremity Shoulder Flexion: AAROM, Both, 15 reps Shoulder Extension: AAROM, Both, 15 reps Shoulder ABduction: AAROM, Both, 15 reps Shoulder Horizontal ABduction: AAROM, Both, 20 reps Shoulder Horizontal ADduction: AAROM, Both, 20 reps Elbow Flexion: Left, 10 reps Elbow Extension: AROM, AAROM, Both, 20 reps Wrist Flexion: Left, 10 reps, AAROM, AROM Wrist Extension: AAROM, AROM, Left, 15 reps Digit Composite Flexion: Left, 20 reps, Squeeze ball Composite Extension: AAROM, PROM, Left, 10 reps Hand Exercises Forearm Supination: AAROM, AROM, Both, 15 reps Forearm Pronation: AROM, AAROM, Both, 15 reps Opposition: Left, 10 reps Other Exercises Other Exercises: PNF D1 diagonals supine with AAROM x 15 BUE Other Exercises: scap elevatin/depression x 10 BUE Other Exercises: sidelying R/L facilitating shoulder flexion /ext x 15    Shoulder Instructions       General Comments      Pertinent Vitals/ Pain       Pain Assessment Pain Assessment: Faces Faces Pain Scale: Hurts little more Pain Location: B shoulders Pain Descriptors / Indicators: Aching, Sore Pain Intervention(s): Limited activity within patient's tolerance, Other (comment) (discussed possible use of voltarne gel for shoulders)  Home Living                                          Prior Functioning/Environment              Frequency  Min 1X/week        Progress Toward Goals  OT Goals(current goals can now be found in the care plan section)  Progress towards OT goals: OT to reassess next treatment  Acute Rehab OT Goals Patient Stated Goal: to get  use of his arms back OT Goal Formulation: With patient Time For Goal Achievement: 02/19/23 Potential to Achieve Goals: Good ADL Goals Pt Will Perform Eating: with min assist;with adaptive utensils;sitting Pt Will Perform Grooming: with mod assist;sitting Pt Will Perform Upper Body Bathing: with set-up;sitting Pt Will Perform Lower Body Bathing: with min assist;sit to/from stand;with adaptive equipment Pt Will Perform Upper Body Dressing: with set-up;with supervision;sitting Pt Will Transfer to Toilet: with mod assist;stand pivot transfer;bedside commode Pt/caregiver will Perform Home Exercise Program: Right Upper extremity;Left upper extremity;Increased ROM;With minimal assist;With written HEP provided Additional ADL Goal #1: Pt will transition from supine to sit EOB with mod assist and HOB flat in preparation for selfcare tasks.  Plan      Co-evaluation  AM-PAC OT "6 Clicks" Daily Activity     Outcome Measure   Help from another person eating meals?: Total Help from another person taking care of personal grooming?: Total Help from another person toileting, which includes using toliet, bedpan, or urinal?: Total Help from another person bathing (including washing, rinsing, drying)?: Total Help from another person to put on and taking off regular upper body clothing?: Total Help from another person to put on and taking off regular lower body clothing?: Total 6 Click Score: 6    End of Session    OT Visit Diagnosis: Unsteadiness on feet (R26.81);Other abnormalities of gait and mobility (R26.89);Repeated falls (R29.6);Muscle weakness (generalized) (M62.81);Pain;Feeding difficulties (R63.3) Pain - Right/Left:  (B) Pain - part of body: Shoulder   Activity Tolerance Patient tolerated treatment well   Patient Left in bed;with call bell/phone within reach;with bed alarm set   Nurse Communication Mobility status;Other (comment) (Keep BUE supported on pillows)         Time: 1000-1053 OT Time Calculation (min): 53 min  Charges: OT General Charges $OT Visit: 1 Visit OT Treatments $Self Care/Home Management : 8-22 mins $Therapeutic Activity: 8-22 mins $Neuromuscular Re-education: 23-37 mins  Luisa Dago, OT/L   Acute OT Clinical Specialist Acute Rehabilitation Services Pager [phone] Office [phone]   [facility] 02/07/2023, 12:22 PM

## 2023-02-07 NOTE — Plan of Care (Signed)

## 2023-02-07 NOTE — Progress Notes (Signed)
Physical Therapy Treatment Patient Details Name: Gabriel Cook MRN: [id number] DOB: 06/10/1970 Today's Date: 02/07/2023   History of Present Illness 52 yo with progressively worsening numbness tingling in all extremities as well as difficulty walking s/p C3-T1 PCDF 10/3. PMH: DM, HTN    PT Comments  Remains motivated to work hard with therapies, eager to get OOB. Required Stedy for sit to stand transfer, Lt knee block. Assist needed for UE placement and heavy Mod for boost to stand several times from bed and Stedy paddles. Able to sustain grip on stedy bar and CGA for NMRE exercises performed today (see details below.) Stedy utilized to transfer to recliner. Patient will continue to benefit from skilled physical therapy services to further improve independence with functional mobility.     If plan is discharge home, recommend the following: Two people to help with walking and/or transfers;Assistance with cooking/housework;Assist for transportation;Help with stairs or ramp for entrance   Can travel by private vehicle        Equipment Recommendations  Other (comment) (defer to post acute)    Recommendations for Other Services Rehab consult     Precautions / Restrictions Precautions Precautions: Cervical Precaution Booklet Issued: Yes (comment) Precaution Comments: reviewed cervical precautions during session with mobility Required Braces or Orthoses: Cervical Brace Cervical Brace: Hard collar;Other (comment) (donn is sitting; on when OOB) Restrictions Weight Bearing Restrictions: No Other Position/Activity Restrictions: cervical restrictions     Mobility  Bed Mobility Overal bed mobility: Needs Assistance Bed Mobility: Rolling, Sidelying to Sit Rolling: Min assist Sidelying to sit: Mod assist       General bed mobility comments: Min assist to roll, assisted with UE, mod assist for trunk, cues for technique.    Transfers Overall transfer level: Needs  assistance Equipment used: Ambulation equipment used Antony Salmon) Transfers: Sit to/from Stand, Bed to chair/wheelchair/BSC Sit to Stand: Mod assist Antony Salmon)           General transfer comment: Mod assist for multilpe sit<>stand transitions with Stedy including bed and stedy paddles. Sometimes required a couple of attempts. VC for foot placement, foward weight shift, rocks for momentum to help with boost. Transfer via Lift Equipment: Stedy  Ambulation/Gait                   Stairs             Wheelchair Mobility     Tilt Bed    Modified Rankin (Stroke Patients Only)       Balance Overall balance assessment: Needs assistance Sitting-balance support: Feet supported Sitting balance-Leahy Scale: Good     Standing balance support: Bilateral upper extremity supported Standing balance-Leahy Scale: Poor Standing balance comment: Needs BUE support CGA from PT in Stedy                            Cognition Arousal: Alert Behavior During Therapy: [facility] for tasks assessed/performed Overall Cognitive Status: Within Functional Limits for tasks assessed                                          Exercises Other Exercises Other Exercises: Standing in stedy with bil UE support: Performed x10 ea - heel raises, alternating toe raises, mini squats, quad set (cues for extension on Lt,)  lateral weight shift with knee off of block pad as tolerated, attempting march but  cannot fully bear weight on Lt. Other Exercises: Seated scapular retraction x10    General Comments        Pertinent Vitals/Pain Pain Assessment Pain Assessment: Faces Faces Pain Scale: Hurts little more Pain Location: B shoulders Pain Descriptors / Indicators: Aching, Sore Pain Intervention(s): Monitored during session, Limited activity within patient's tolerance    Home Living                          Prior Function            PT Goals (current goals can now be  found in the care plan section) Acute Rehab PT Goals Patient Stated Goal: Go to rehab, get stronger, return home, work. PT Goal Formulation: With patient Time For Goal Achievement: 02/15/23 Potential to Achieve Goals: Good Progress towards PT goals: Progressing toward goals    Frequency    Min 1X/week      PT Plan      Co-evaluation              AM-PAC PT "6 Clicks" Mobility   Outcome Measure  Help needed turning from your back to your side while in a flat bed without using bedrails?: A Little Help needed moving from lying on your back to sitting on the side of a flat bed without using bedrails?: A Lot Help needed moving to and from a bed to a chair (including a wheelchair)?: Total Help needed standing up from a chair using your arms (e.g., wheelchair or bedside chair)?: Total Help needed to walk in hospital room?: Total Help needed climbing 3-5 steps with a railing? : Total 6 Click Score: 9    End of Session Equipment Utilized During Treatment: Gait belt;Cervical collar Activity Tolerance: Patient tolerated treatment well Patient left: with call bell/phone within reach;in chair;with chair alarm set (Declined SCDs) Nurse Communication: Mobility status PT Visit Diagnosis: Unsteadiness on feet (R26.81);Repeated falls (R29.6);Muscle weakness (generalized) (M62.81);History of falling (Z91.81);Ataxic gait (R26.0);Difficulty in walking, not elsewhere classified (R26.2);Other symptoms and signs involving the nervous system (R29.898);Pain Pain - Right/Left: Right Pain - part of body:  (hand)     Time: 4098-1191 PT Time Calculation (min) (ACUTE ONLY): 30 min  Charges:    $Therapeutic Activity: 8-22 mins $Neuromuscular Re-education: 8-22 mins PT General Charges $$ ACUTE PT VISIT: 1 Visit                     [name], PT, DPT [facility]  Rehabilitation Services Physical Therapist Office: [phone] Website: [email or web address]    [name] 02/07/2023,  4:26 PM

## 2023-02-07 NOTE — Progress Notes (Signed)
Pt refused vital signs for 12am and 4am this shift, education provided, will continue to monitor.

## 2023-02-08 LAB — GLUCOSE, CAPILLARY
Glucose-Capillary: 191 mg/dL — ABNORMAL HIGH (ref 70–99)
Glucose-Capillary: 156 mg/dL — ABNORMAL HIGH (ref 70–99)
Glucose-Capillary: 258 mg/dL — ABNORMAL HIGH (ref 70–99)
Glucose-Capillary: 261 mg/dL — ABNORMAL HIGH (ref 70–99)

## 2023-02-08 MED ORDER — DEXAMETHASONE 4 MG PO TABS
4.0000 mg | ORAL_TABLET | Freq: Three times a day (TID) | ORAL | Status: DC
  Administered 2023-02-08 – 2023-02-09 (×4): 4 mg via ORAL
  Filled 2023-02-08 (×5): qty 1

## 2023-02-08 NOTE — Progress Notes (Signed)
Orthopedic Tech Progress Note Patient Details:  [name] Aug 30, 1970 [id number]  Called in order to [employer] for a RESTING HAND SPLINT   Patient ID: Gabriel Cook, male   DOB: 1970/07/30, 52 y.o.   MRN: [id number]  Donald Pore 02/08/2023, 6:55 PM

## 2023-02-08 NOTE — Plan of Care (Signed)
Bariatric bed ordered and patient placed in bed after sitting in chair for afternoon.  Alert and oriented. Decadron changed to oral tablets.  Pending discharge to SNF tomorrow.   Problem: Education: Goal: Knowledge of General Education information will improve Description: Including pain rating scale, medication(s)/side effects and non-pharmacologic comfort measures Outcome: Progressing   Problem: Health Behavior/Discharge Planning: Goal: Ability to manage health-related needs will improve Outcome: Progressing   Problem: Clinical Measurements: Goal: Ability to maintain clinical measurements within normal limits will improve Outcome: Progressing Goal: Will remain free from infection Outcome: Progressing Goal: Diagnostic test results will improve Outcome: Progressing Goal: Respiratory complications will improve Outcome: Progressing Goal: Cardiovascular complication will be avoided Outcome: Progressing   Problem: Activity: Goal: Risk for activity intolerance will decrease Outcome: Progressing   Problem: Nutrition: Goal: Adequate nutrition will be maintained Outcome: Progressing   Problem: Coping: Goal: Level of anxiety will decrease Outcome: Progressing   Problem: Elimination: Goal: Will not experience complications related to bowel motility Outcome: Progressing Goal: Will not experience complications related to urinary retention Outcome: Progressing   Problem: Pain Managment: Goal: General experience of comfort will improve Outcome: Progressing   Problem: Safety: Goal: Ability to remain free from injury will improve Outcome: Progressing   Problem: Skin Integrity: Goal: Risk for impaired skin integrity will decrease Outcome: Progressing   Problem: Education: Goal: Ability to verbalize activity precautions or restrictions will improve Outcome: Progressing Goal: Knowledge of the prescribed therapeutic regimen will improve Outcome: Progressing Goal: Understanding  of discharge needs will improve Outcome: Progressing   Problem: Activity: Goal: Ability to avoid complications of mobility impairment will improve Outcome: Progressing Goal: Ability to tolerate increased activity will improve Outcome: Progressing Goal: Will remain free from falls Outcome: Progressing   Problem: Bowel/Gastric: Goal: Gastrointestinal status for postoperative course will improve Outcome: Progressing   Problem: Clinical Measurements: Goal: Ability to maintain clinical measurements within normal limits will improve Outcome: Progressing Goal: Postoperative complications will be avoided or minimized Outcome: Progressing Goal: Diagnostic test results will improve Outcome: Progressing   Problem: Pain Management: Goal: Pain level will decrease Outcome: Progressing   Problem: Skin Integrity: Goal: Will show signs of wound healing Outcome: Progressing   Problem: Health Behavior/Discharge Planning: Goal: Identification of resources available to assist in meeting health care needs will improve Outcome: Progressing   Problem: Bladder/Genitourinary: Goal: Urinary functional status for postoperative course will improve Outcome: Progressing   Problem: Education: Goal: Ability to describe self-care measures that may prevent or decrease complications (Diabetes Survival Skills Education) will improve Outcome: Progressing Goal: Individualized Educational Video(s) Outcome: Progressing   Problem: Coping: Goal: Ability to adjust to condition or change in health will improve Outcome: Progressing   Problem: Fluid Volume: Goal: Ability to maintain a balanced intake and output will improve Outcome: Progressing   Problem: Health Behavior/Discharge Planning: Goal: Ability to identify and utilize available resources and services will improve Outcome: Progressing Goal: Ability to manage health-related needs will improve Outcome: Progressing   Problem: Metabolic: Goal:  Ability to maintain appropriate glucose levels will improve Outcome: Progressing   Problem: Nutritional: Goal: Maintenance of adequate nutrition will improve Outcome: Progressing Goal: Progress toward achieving an optimal weight will improve Outcome: Progressing

## 2023-02-08 NOTE — Progress Notes (Signed)
Occupational Therapy Treatment Patient Details Name: Gabriel Cook MRN: [id number] DOB: 12-Jun-1970 Today's Date: 02/08/2023   History of present illness 52 yo with progressively worsening numbness tingling in all extremities as well as difficulty walking s/p C3-T1 PCDF 10/3. MRI 10/10 Postoperative fluid collection within the posterior soft tissues  spanning from the C2-3 to the T1-2 levels measuring approximately  11.3 x 5.1 x 5.4 cm. Collection is compatible with a postoperative  seroma or hematoma.  PMH: DM, HTN   OT comments  Pt continues to have significant functional decline regarding use of BUE, with L digit/wrist extension weaker than yesterday. Due to BUE weakness, pt requires Total A for all ADL tasks. Go[place]eneck arm support used to modifiy a drinking system so that Gabriel Cook can have access to water after set up. Educated Gabriel Cook on availability of gooseneck arm to order for use at [facility]. K tape to L shoulder to increase support to [facility] joint. K tape also applied to dorsum of L forearm/digits to facilitate extension. Educated pt/nsg staff on importance of keeping BUE supported on pillows AT ALL TIMES to reduce strain on shoulders and reduce pain. Spiritual care consult placed to provide support. Pt very appreciative.       If plan is discharge home, recommend the following:  Two people to help with walking and/or transfers;A lot of help with bathing/dressing/bathroom;Assistance with cooking/housework;Assistance with feeding;Direct supervision/assist for medications management;Direct supervision/assist for financial management;Assist for transportation;Help with stairs or ramp for entrance   Equipment Recommendations  Wheelchair (measurements OT);Wheelchair cushion (measurements OT)    Recommendations for Other Services      Precautions / Restrictions Precautions Precautions: Cervical;Fall;Other (comment) (B shoulder pain) Precaution Comments: reviewed cervical precautions Required  Braces or Orthoses: Cervical Brace Cervical Brace: Hard collar;Other (comment)       Mobility Bed Mobility               General bed mobility comments: OOB in chair    Transfers                   General transfer comment: not assessed this sesison;Maximove pad in chair     Balance     Sitting balance-Leahy Scale: Good                                     ADL either performed or assessed with clinical judgement   ADL Overall ADL's : Needs assistance/impaired   Eating/Feeding Details (indicate cue type and reason): gooseneck arm suport from breath call system used to modify drinkingsystem as pt is unable to move a drink to his mouth due to BUE shoulder/elbow weakness; after adaptation, tubing set up at chair level to enable Gabriel Cook to drink water                                   General ADL Comments: Educated pt on availability of gooseneck phone holder to use at SNF for rehab    Extremity/Trunk Assessment Upper Extremity Assessment Upper Extremity Assessment: RUE deficits/detail;LUE deficits/detail RUE Deficits / Details: significant functional change, especially elbow/shoulder: hand 4+/5 with good intrinsic strength - using functionally; pain @ index finger/dorsum of hand is better however complains that index finger and thumb are numb; wrist flex/ext 4+/5; supination/pronation 3/5; elbow fexion 2-/5 ext 4/5; shoulder flex 1/5 abd 1/5 ext2/5 scap elevation 4/5 depression4/5  RUE Coordination: decreased gross motor LUE Deficits / Details: dominant arm; weaker than right - more involvement in wrist/finger extension. hand - gross grasp 3+/5; only able to oppose thumb to index finger; unable to extend digits - lacks @ 45 degrees extension index and middle; unalbe to extend ring and little at all; wrist flex 3/5 extension 2+/5;supination 2/5 pronation 2+/5; elbow flexion2/5; extension 2/5; shoulder flexion/abduction 1/5; scapular elevation  3/5; depression 2/5 LUE Coordination: decreased fine motor;decreased gross motor   Lower Extremity Assessment Lower Extremity Assessment: Defer to PT evaluation        Vision       Perception     Praxis      Cognition Arousal: Alert Behavior During Therapy: WFL for tasks assessed/performed Overall Cognitive Status: Within Functional Limits for tasks assessed                                          Exercises General Exercises - Upper Extremity Shoulder Flexion: AAROM, Both, 15 reps Shoulder Extension: AAROM, Both, 15 reps Shoulder ABduction: AAROM, Both, 15 reps Shoulder Horizontal ABduction: AAROM, Both, 20 reps Shoulder Horizontal ADduction: AAROM, Both, 20 reps Elbow Flexion: Left, 10 reps Elbow Extension: AROM, AAROM, Both, 20 reps Wrist Flexion: Left, 10 reps, AAROM, AROM Wrist Extension: AAROM, AROM, Left, 15 reps Digit Composite Flexion: Left, 20 reps, Squeeze ball Composite Extension: AAROM, PROM, Left, 10 reps    Shoulder Instructions       General Comments L shoulder taped for GH instaiblity given shoulder weakness; handout provided to use at SNF; L digits/wrist taped to facilitate extension - improved aiblity to open hand wiht use of K tape    Pertinent Vitals/ Pain       Pain Assessment Pain Assessment: Faces Faces Pain Scale: Hurts little more Pain Location: B shoulders Pain Descriptors / Indicators: Aching, Sore Pain Intervention(s): Limited activity within patient's tolerance, Repositioned, Other (comment) (K tape L shoulder; B UE supported)  Home Living                                          Prior Functioning/Environment              Frequency  Min 1X/week        Progress Toward Goals  OT Goals(current goals can now be found in the care plan section)  Progress towards OT goals: Goals updated  Acute Rehab OT Goals Patient Stated Goal: to get use of his arms back OT Goal Formulation: With  patient Time For Goal Achievement: 02/19/23 Potential to Achieve Goals: Good ADL Goals Pt Will Perform Eating: with max assist;sitting;with adaptive utensils Pt Will Perform Lower Body Bathing: with max assist;sitting/lateral leans Pt/caregiver will Perform Home Exercise Program: Increased strength;Increased ROM;Both right and left upper extremity;With minimal assist;With written HEP provided Additional ADL Goal #1: Pt will independnetly direct caregiver in use of L resting hand splint for night use only to reduce risk of contracture development Additional ADL Goal #2: Pt will demonstrate a hand to mouth pattern with R/L UE wiht mod A against gravity in preparation for self feeding Additional ADL Goal #3: Pt will use a modified adapted system with gooseneck arm support to increase his ability to independently drink fluids after set up  Plan  Co-evaluation                 AM-PAC OT "6 Clicks" Daily Activity     Outcome Measure   Help from another person eating meals?: Total Help from another person taking care of personal grooming?: Total Help from another person toileting, which includes using toliet, bedpan, or urinal?: Total Help from another person bathing (including washing, rinsing, drying)?: Total Help from another person to put on and taking off regular upper body clothing?: Total Help from another person to put on and taking off regular lower body clothing?: Total 6 Click Score: 6    End of Session Equipment Utilized During Treatment: Cervical collar  OT Visit Diagnosis: Unsteadiness on feet (R26.81);Other abnormalities of gait and mobility (R26.89);Muscle weakness (generalized) (M62.81);Pain Pain - Right/Left:  (B) Pain - part of body: Shoulder (L worse than R)   Activity Tolerance Patient tolerated treatment well   Patient Left in chair;with call bell/phone within reach   Nurse Communication Other (comment) (use of drinking system)        Time:  1062-6948 OT Time Calculation (min): 45 min  Charges: OT General Charges $OT Visit: 1 Visit OT Treatments $Self Care/Home Management : 8-22 mins $Therapeutic Activity: 8-22 mins $Therapeutic Exercise: 8-22 mins  Luisa Dago, OT/L   Acute OT Clinical Specialist Acute Rehabilitation Services Pager [phone] Office [phone]   [facility] 02/08/2023, 5:43 PM

## 2023-02-08 NOTE — TOC Progression Note (Signed)
Transition of Care [facility]) - Progression Note    Patient Details  Name: Gabriel Cook MRN: [id number] Date of Birth: 04/19/1971  Transition of Care [facility]) CM/SW Contact  Baldemar Lenis, [place] Phone Number: 02/08/2023, 1:51 PM  Clinical Narrative:   CSW spoke with case manager with [employer], patient does have a SNF benefit in his plan, and patient received bed offer at [facility]. CSW to update patient on bed offer.    Expected Discharge Plan: Skilled Nursing Facility Barriers to Discharge: Continued Medical Work up  Expected Discharge Plan and Services   Discharge Planning Services: CM Consult Post Acute Care Choice: IP Rehab Living arrangements for the past 2 months: Single Family Home                                       Social Determinants of Health (SDOH) Interventions SDOH Screenings   Food Insecurity: No Food Insecurity (02/03/2023)  Housing: Low Risk  (02/03/2023)  Transportation Needs: No Transportation Needs (02/03/2023)  Utilities: Not At Risk (02/03/2023)  Depression (PHQ2-9): Low Risk  (06/28/2022)  Tobacco Use: High Risk (01/29/2023)    Readmission Risk Interventions     No data to display

## 2023-02-08 NOTE — Progress Notes (Signed)
   Providing Compassionate, Quality Care - Together  NEUROSURGERY PROGRESS NOTE   S: No issues overnight.  Pain controlled  O: EXAM:  BP (!) 158/105 (BP Location: Left Arm)   Pulse (!) 59   Temp 98.4 F (36.9 C) (Oral)   Resp 18   Ht 6\' 2"  (1.88 m)   Wt 135.6 kg   SpO2 98%   BMI 38.38 kg/m   Awake, alert, oriented x3 PERRL Speech fluent, appropriate  CNs grossly intact  RUE delt 1/5, bi 1/5, grip/tri 4/5 FE 4/5 LUE delt 1/5, bi/tri 2/5, FE 3/5, grip 3/5 4+/5 BLE    ASSESSMENT:  52 y.o. male with    C3-T1 CSM, s/p PCDF C3-T1                           PLAN: -Patient has excepted SNF placement, plan for discharge tomorrow -Will place him on a Decadron taper    Thank you for allowing me to participate in this patient's care.  Please do not hesitate to call with questions or concerns.   Monia Pouch, DO Neurosurgeon [facility] [phone]

## 2023-02-08 NOTE — Inpatient Diabetes Management (Signed)
Inpatient Diabetes Program Recommendations  AACE/ADA: New Consensus Statement on Inpatient Glycemic Control (2015)  Target Ranges:  Prepandial:   less than 140 mg/dL      Peak postprandial:   less than 180 mg/dL (1-2 hours)      Critically ill patients:  140 - 180 mg/dL   Lab Results  Component Value Date   GLUCAP 191 (H) 02/08/2023   HGBA1C 5.7 (H) 02/05/2023    Review of Glycemic Control  Latest Reference Range & Units 02/07/23 07:00 02/07/23 11:39 02/07/23 15:38 02/07/23 21:22 02/08/23 06:07  Glucose-Capillary 70 - 99 mg/dL 323 (H) 557 (H) 322 (H) 344 (H) 191 (H)  (H): Data is abnormally high Diabetes history: Type 2 DM Outpatient Diabetes medications:  Current orders for Inpatient glycemic control: Novolog 0-20 units TID & HS Decadron 4 mg TID  Inpatient Diabetes Program Recommendations:    In the setting of steroids: Consider adding Novolog 3 units TID (Assuming patient consuming >50% of meals)  Thanks, Lujean Rave, MSN, RNC-OB Diabetes Coordinator [phone] (8a-5p)

## 2023-02-08 NOTE — Progress Notes (Signed)
Call made to Dr. Mattie Marlin office regarding loss of IV access and question whether to continue IV or change to oral.     02/08/23 1150  Provider Notification  Provider Name/Title Dr. Dawley/ Attending  Date Provider Notified 02/08/23  Time Provider Notified 1150  Method of Notification Call  Notification Reason Other (Comment) (Loss of IV access and need for dexamethasone)  Provider response Evaluate remotely;See new orders  Date of Provider Response 02/08/23  Time of Provider Response 1152

## 2023-02-08 NOTE — Progress Notes (Signed)
Physical Therapy Treatment Patient Details Name: Gabriel Cook MRN: [id number] DOB: 02/19/1971 Today's Date: 02/08/2023   History of Present Illness 52 yo with progressively worsening numbness tingling in all extremities as well as difficulty walking s/p C3-T1 PCDF 10/3. PMH: DM, HTN    PT Comments  Demonstrates some improved LLE control in WB today. Emphasis on NMRE tolerating while standing upright on Stedy ~15 min. Heavy mod assist for boost to stand from elevated bed surface and from Stedy paddles. He required assist to open Lt hand to grip Stedy bar, seemingly weaker grip, OT notified and can assess further based on their more detailed UE assessment. Remains highly motivated but understandably frustrated and discouraged by progressive weakness of UEs post-op. Will continue to progress towards acute functional goals as able. Patient will continue to benefit from skilled physical therapy services to further improve independence with functional mobility. Patient will benefit from intensive inpatient follow up therapy, >3 hours/day     If plan is discharge home, recommend the following: Two people to help with walking and/or transfers;Assistance with cooking/housework;Assist for transportation;Help with stairs or ramp for entrance   Can travel by private vehicle        Equipment Recommendations  Other (comment) (defer to post acute)    Recommendations for Other Services Rehab consult     Precautions / Restrictions Precautions Precautions: Cervical Precaution Booklet Issued: Yes (comment) Precaution Comments: reviewed cervical precautions during session with mobility Required Braces or Orthoses: Cervical Brace Cervical Brace: Hard collar;Other (comment) (donn is sitting; on when OOB) Restrictions Weight Bearing Restrictions: No Other Position/Activity Restrictions: cervical restrictions     Mobility  Bed Mobility Overal bed mobility: Needs Assistance Bed Mobility: Rolling,  Sidelying to Sit Rolling: Min assist Sidelying to sit: Mod assist       General bed mobility comments: Min assist to roll, assisted with UE, mod assist for trunk, cues for technique. More effortful for pt today.    Transfers Overall transfer level: Needs assistance Equipment used: Ambulation equipment used Antony Salmon) Transfers: Sit to/from Stand, Bed to chair/wheelchair/BSC Sit to Stand: Mod assist, From elevated surface, Via lift equipment Antony Salmon)           General transfer comment: Mod assist for boost to stand from elevated bed surface and stedy paddles. Pt attempted on his own with cues for technique and mechanics but unable to clear buttocks. [place] for [employer] Requires assist for BIL UEs to reach [place] rail, needed assist to open Lt hand today. Transfer via Lift Equipment: Stedy  Ambulation/Gait             Pre-gait activities: Weight shifting, able to clear Rt foot today with pre-gait, heavy reliance on Stedy rail and knee block for Lt.     Stairs             Wheelchair Mobility     Tilt Bed    Modified Rankin (Stroke Patients Only)       Balance Overall balance assessment: Needs assistance Sitting-balance support: Feet supported Sitting balance-Leahy Scale: Good     Standing balance support: Bilateral upper extremity supported Standing balance-Leahy Scale: Poor Standing balance comment: Needs BUE support CGA-sup from PT in Stedy                            Cognition Arousal: Alert Behavior During Therapy: [facility] for tasks assessed/performed Overall Cognitive Status: Within Functional Limits for tasks assessed  Exercises Other Exercises Other Exercises: Standing in stedy with bil UE support: Performed x10 ea - heel raises, alternating toe raises, mini squats, quad set (cues for extension on Lt,)  lateral weight shift with knee off of block pad as tolerated. Emphasis today was on  fully upright stance which he was able to perform with approx 50/50 WB through ea LE without LLE buckling; >60% WB on Lt resulted in Lt knee to buckle. Heavy NMRE to develop awareness of limits and positional sense.    General Comments        Pertinent Vitals/Pain Pain Assessment Pain Assessment: Faces Faces Pain Scale: Hurts little more Pain Location: B shoulders Pain Descriptors / Indicators: Aching, Sore Pain Intervention(s): Monitored during session, Repositioned, Limited activity within patient's tolerance    Home Living                          Prior Function            PT Goals (current goals can now be found in the care plan section) Acute Rehab PT Goals Patient Stated Goal: Go to rehab, get stronger, return home, work. PT Goal Formulation: With patient Time For Goal Achievement: 02/15/23 Potential to Achieve Goals: Good Progress towards PT goals: Progressing toward goals    Frequency    Min 1X/week      PT Plan      Co-evaluation              AM-PAC PT "6 Clicks" Mobility   Outcome Measure  Help needed turning from your back to your side while in a flat bed without using bedrails?: A Little Help needed moving from lying on your back to sitting on the side of a flat bed without using bedrails?: A Lot Help needed moving to and from a bed to a chair (including a wheelchair)?: Total Help needed standing up from a chair using your arms (e.g., wheelchair or bedside chair)?: Total Help needed to walk in hospital room?: Total Help needed climbing 3-5 steps with a railing? : Total 6 Click Score: 9    End of Session Equipment Utilized During Treatment: Gait belt;Cervical collar Activity Tolerance: Patient tolerated treatment well Patient left: with call bell/phone within reach;in chair;with chair alarm set (Declined SCDs) Nurse Communication: Mobility status;Need for lift equipment PT Visit Diagnosis: Unsteadiness on feet (R26.81);Repeated falls  (R29.6);Muscle weakness (generalized) (M62.81);History of falling (Z91.81);Ataxic gait (R26.0);Difficulty in walking, not elsewhere classified (R26.2);Other symptoms and signs involving the nervous system (R29.898);Pain Pain - Right/Left: Right Pain - part of body:  (hand)     Time: 9562-1308 PT Time Calculation (min) (ACUTE ONLY): 26 min  Charges:    $Therapeutic Activity: 8-22 mins $Neuromuscular Re-education: 8-22 mins PT General Charges $$ ACUTE PT VISIT: 1 Visit                     [name], PT, DPT [facility]  Rehabilitation Services Physical Therapist Office: [phone] Website: [email or web address]    [name] 02/08/2023, 1:00 PM

## 2023-02-08 NOTE — TOC Progression Note (Signed)
Transition of Care [facility]) - Progression Note    Patient Details  Name: Gabriel Cook MRN: [id number] Date of Birth: 1970/12/14  Transition of Care [facility]) CM/SW Contact  Chelsae Zanella Felipa Emory, Student-Social Work Phone Number: 02/08/2023, 1:50 PM  Clinical Narrative:   MSW Student Met with patient to provide update on bed offer at [facility]. Patient asking for information on [facility]. MSW Student printed information off and reviewed with patient. Patient is In agreement with [facility]. [facility] has already obtained insurance approval. Patient requesting transfer tomorrow and [facility] can accept. MSW Student will follow.     Expected Discharge Plan: Skilled Nursing Facility Barriers to Discharge: Continued Medical Work up  Expected Discharge Plan and Services   Discharge Planning Services: CM Consult Post Acute Care Choice: IP Rehab Living arrangements for the past 2 months: Single Family Home                                       Social Determinants of Health (SDOH) Interventions SDOH Screenings   Food Insecurity: No Food Insecurity (02/03/2023)  Housing: Low Risk  (02/03/2023)  Transportation Needs: No Transportation Needs (02/03/2023)  Utilities: Not At Risk (02/03/2023)  Depression (PHQ2-9): Low Risk  (06/28/2022)  Tobacco Use: High Risk (01/29/2023)    Readmission Risk Interventions     No data to display

## 2023-02-09 ENCOUNTER — Other Ambulatory Visit (HOSPITAL_COMMUNITY): Payer: Self-pay

## 2023-02-09 LAB — GLUCOSE, CAPILLARY: Glucose-Capillary: 167 mg/dL — ABNORMAL HIGH (ref 70–99)

## 2023-02-09 MED ORDER — METHOCARBAMOL 500 MG PO TABS: 500.0000 mg | ORAL_TABLET | Freq: Four times a day (QID) | ORAL | 2 refills | Status: AC | PRN

## 2023-02-09 MED ORDER — HYDRALAZINE HCL 20 MG/ML IJ SOLN: 10.0000 mg | INTRAMUSCULAR | Status: DC | PRN

## 2023-02-09 MED ORDER — OXYCODONE HCL 10 MG PO TABS: 10.0000 mg | ORAL_TABLET | ORAL | 0 refills | Status: AC | PRN

## 2023-02-09 MED ORDER — DEXAMETHASONE 4 MG PO TABS
ORAL_TABLET | ORAL | 0 refills | Status: AC
  Filled 2023-02-09: qty 14, 9d supply, fill #0

## 2023-02-09 NOTE — Progress Notes (Signed)
Occupational Therapy Treatment Patient Details Name: Gabriel Cook MRN: [id number] DOB: 04-05-1971 Today's Date: 02/09/2023   History of present illness 52 yo with progressively worsening numbness tingling in all extremities as well as difficulty walking s/p C3-T1 PCDF 10/3. MRI 10/10 Postoperative fluid collection within the posterior soft tissues  spanning from the C2-3 to the T1-2 levels measuring approximately  11.3 x 5.1 x 5.4 cm. Collection is compatible with a postoperative  seroma or hematoma.  PMH: DM, HTN   OT comments  Pt. Seen for skilled OT treatment session.  K tape on L UE wnl some, initial/minimal peeling on L index finger but all other tape portions looking good.  Reviewed pillow placement for bues.  Performed P/AROM BUES.  Pt. Doing well with digit flex/ext. B hands.  Did not wish to cont. With gooseneck device for drinking water.  Remains in great spirits and is motivated for continued therapies and gains.        If plan is discharge home, recommend the following:  Two people to help with walking and/or transfers;A lot of help with bathing/dressing/bathroom;Assistance with cooking/housework;Assistance with feeding;Direct supervision/assist for medications management;Direct supervision/assist for financial management;Assist for transportation;Help with stairs or ramp for entrance   Equipment Recommendations  Wheelchair (measurements OT);Wheelchair cushion (measurements OT)    Recommendations for Other Services Rehab consult    Precautions / Restrictions Precautions Precautions: Cervical;Fall;Other (comment) (B shoulder pain) Required Braces or Orthoses: Hard collar Restrictions Other Position/Activity Restrictions: cervical restrictions -reviewed limiting head/neck rom when in bed without the hard collar on      Mobility Bed Mobility                    Transfers                         Balance                                            ADL either performed or assessed with clinical judgement   ADL   Eating/Feeding: Total assistance Eating/Feeding Details (indicate cue type and reason): gooseneck support was off of bed upon arrival. pt. reports it initally worked but beds were changed and it was not resecured properly and created a large spill.  attempted and encouarged pt. to allow re attachment and he states he did not want it "im fine, its really fine i dont drink a lot of water anyway and i will just call when i need or want some"                                        Extremity/Trunk Assessment              Vision       Perception     Praxis      Cognition Arousal: Alert Behavior During Therapy: WFL for tasks assessed/performed Overall Cognitive Status: Within Functional Limits for tasks assessed                                          Exercises General Exercises - Upper Extremity Elbow Flexion: Left, 10 reps Elbow Extension: AROM, AAROM,  Both, 20 reps Digit Composite Flexion: Left, 20 reps Composite Extension: AAROM, PROM, Left, 10 reps    Shoulder Instructions       General Comments      Pertinent Vitals/ Pain       Pain Assessment Pain Assessment: No/denies pain  Home Living                                          Prior Functioning/Environment              Frequency  Min 1X/week        Progress Toward Goals  OT Goals(current goals can now be found in the care plan section)  Progress towards OT goals: Progressing toward goals     Plan      Co-evaluation                 AM-PAC OT "6 Clicks" Daily Activity     Outcome Measure   Help from another person eating meals?: Total Help from another person taking care of personal grooming?: Total Help from another person toileting, which includes using toliet, bedpan, or urinal?: Total Help from another person bathing (including washing, rinsing,  drying)?: Total Help from another person to put on and taking off regular upper body clothing?: Total Help from another person to put on and taking off regular lower body clothing?: Total 6 Click Score: 6    End of Session    OT Visit Diagnosis: Unsteadiness on feet (R26.81);Other abnormalities of gait and mobility (R26.89);Muscle weakness (generalized) (M62.81);Pain Pain - part of body: Shoulder   Activity Tolerance Patient tolerated treatment well   Patient Left in bed;with call bell/phone within reach   Nurse Communication          Time: 9147-8295 OT Time Calculation (min): 17 min  Charges: OT General Charges $OT Visit: 1 Visit OT Treatments $Therapeutic Exercise: 8-22 mins  Boneta Lucks, COTA/L Acute Rehabilitation [phone]   Alessandra Bevels Lorraine-COTA/L 02/09/2023, 11:54 AM

## 2023-02-09 NOTE — Discharge Summary (Signed)
Patient ID: Gabriel Cook MRN: [id number] DOB/AGE: March 21, 1971 52 y.o.  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 male who was admitted following an uncomplicated PSF C3-T1. They were recovered in PACU and transferred to the floor. Hospital course was complicated by C5 palsy and most significant for b/l deltoid weakness. Post op imaging did not exhibit any cord compression. Pt stable for discharge today to SNF. Pt to f/u in office for routine post op visit. Pt is in agreement w/ plan.    Discharge Exam: Blood pressure (!) 171/94, pulse 64, temperature 98.8 F (37.1 C), temperature source Oral, resp. rate 16, height 6\' 2"  (1.88 m), weight 135.6 kg, SpO2 98%. Awake, alert, oriented x3 Speech fluent, appropriate  RUE delt 1/5, bi 1/5, grip/tri 4/5 FE 4/5 LUE delt 1/5, bi/tri 2/5, FE 3/5, grip 3/5 4+/5 BLE  Incision c/d/I  Disposition: Discharge disposition: 03-Skilled Nursing Facility       Discharge Instructions     Incentive spirometry RT   Complete by: As directed       Allergies as of 02/09/2023   No Known Allergies      Medication List     TAKE these medications    dexamethasone 4 MG tablet Commonly known as: DECADRON Take 1 tablet (4 mg total) by mouth 3 (three) times daily for 2 days, THEN 0.5 tablets (2 mg total) 3 (three) times daily for 3 days, THEN 0.5 tablets (2 mg total) 2 (two) times daily for 2 days, THEN 0.5 tablets (2 mg total) daily for 2 days. Start taking on: February 09, 2023   methocarbamol 500 MG tablet Commonly known as: ROBAXIN Take 1 tablet (500 mg total) by mouth every 6 (six) hours as needed for muscle  spasms.   Oxycodone HCl 10 MG Tabs Take 1 tablet (10 mg total) by mouth every 4 (four) hours as needed for severe pain ((score 7 to 10)).        Contact information for after-discharge care     Destination     [facility] .   Service: Skilled Nursing Contact information: [address] [place] [address] [phone]                     Signed: Clovis Riley 02/09/2023, 9:53 AM

## 2023-02-09 NOTE — Progress Notes (Signed)
Attempted to give report. Called direct line and the hall and was able to talk to [facility] the Diplomatic Services operational officer and left my number for the nurse to cal back to receive report. Pt wheeled off unit by [facility]

## 2023-02-09 NOTE — TOC Transition Note (Signed)
Transition of Care [facility]) - CM/SW Discharge Note   Patient Details  Name: Gabriel Cook MRN: [id number] Date of Birth: [birth year]  Transition of Care [facility]) CM/SW Contact:  Deatra Robinson, LCSW Phone Number: 02/09/2023, 10:16 AM   Clinical Narrative: pt for dc to [facility] today. Spoke to [place] in admissions who confirmed they are prepared to admit pt to room 109B. Pt aware of dc and reports agreeable. RN provided with number for report and PTAR arranged for transport. SW signing off at dc.   Dellie Burns, MSW, LCSW [phone] (coverage)      Final next level of care: Skilled Nursing Facility Barriers to Discharge: No Barriers Identified   Patient Goals and CMS Choice CMS Medicare.gov Compare Post Acute Care list provided to:: Patient Choice offered to / list presented to : Patient  Discharge Placement                Patient chooses bed at: Other - please specify in the comment section below: ([facility]) Patient to be transferred to facility by: PTAR Name of family member notified: Pt to update family Patient and family notified of of transfer: 02/09/23  Discharge Plan and Services Additional resources added to the After Visit Summary for     Discharge Planning Services: CM Consult Post Acute Care Choice: IP Rehab                               Social Determinants of Health (SDOH) Interventions SDOH Screenings   Food Insecurity: No Food Insecurity (02/03/2023)  Housing: Low Risk  (02/03/2023)  Transportation Needs: No Transportation Needs (02/03/2023)  Utilities: Not At Risk (02/03/2023)  Depression (PHQ2-9): Low Risk  (06/28/2022)  Tobacco Use: High Risk (01/29/2023)     Readmission Risk Interventions     No data to display

## 2023-02-11 ENCOUNTER — Encounter (HOSPITAL_COMMUNITY): Payer: Self-pay | Admitting: Neurological Surgery

## 2023-02-11 DIAGNOSIS — R5381 Other malaise: Secondary | ICD-10-CM | POA: Diagnosis not present

## 2023-02-11 DIAGNOSIS — I10 Essential (primary) hypertension: Secondary | ICD-10-CM | POA: Diagnosis not present

## 2023-02-11 DIAGNOSIS — M4802 Spinal stenosis, cervical region: Secondary | ICD-10-CM | POA: Diagnosis not present

## 2023-02-12 DIAGNOSIS — M4802 Spinal stenosis, cervical region: Secondary | ICD-10-CM | POA: Diagnosis not present

## 2023-02-12 DIAGNOSIS — R5381 Other malaise: Secondary | ICD-10-CM | POA: Diagnosis not present

## 2023-02-15 DIAGNOSIS — M4802 Spinal stenosis, cervical region: Secondary | ICD-10-CM | POA: Diagnosis not present

## 2023-02-15 DIAGNOSIS — G894 Chronic pain syndrome: Secondary | ICD-10-CM | POA: Diagnosis not present

## 2023-02-15 DIAGNOSIS — R5381 Other malaise: Secondary | ICD-10-CM | POA: Diagnosis not present

## 2023-02-15 DIAGNOSIS — K5901 Slow transit constipation: Secondary | ICD-10-CM | POA: Diagnosis not present

## 2023-02-20 DIAGNOSIS — R5381 Other malaise: Secondary | ICD-10-CM | POA: Diagnosis not present

## 2023-02-20 DIAGNOSIS — M4802 Spinal stenosis, cervical region: Secondary | ICD-10-CM | POA: Diagnosis not present

## 2023-02-20 DIAGNOSIS — K5901 Slow transit constipation: Secondary | ICD-10-CM | POA: Diagnosis not present

## 2023-02-20 DIAGNOSIS — G894 Chronic pain syndrome: Secondary | ICD-10-CM | POA: Diagnosis not present

## 2023-02-22 DIAGNOSIS — Z6835 Body mass index (BMI) 35.0-35.9, adult: Secondary | ICD-10-CM | POA: Diagnosis not present

## 2023-02-22 DIAGNOSIS — G959 Disease of spinal cord, unspecified: Secondary | ICD-10-CM | POA: Diagnosis not present

## 2023-02-22 DIAGNOSIS — M4802 Spinal stenosis, cervical region: Secondary | ICD-10-CM | POA: Diagnosis not present

## 2023-03-08 DIAGNOSIS — G894 Chronic pain syndrome: Secondary | ICD-10-CM | POA: Diagnosis not present

## 2023-03-08 DIAGNOSIS — M4802 Spinal stenosis, cervical region: Secondary | ICD-10-CM | POA: Diagnosis not present

## 2023-03-08 DIAGNOSIS — K5901 Slow transit constipation: Secondary | ICD-10-CM | POA: Diagnosis not present

## 2023-03-08 DIAGNOSIS — R5381 Other malaise: Secondary | ICD-10-CM | POA: Diagnosis not present

## 2023-03-22 DIAGNOSIS — K5901 Slow transit constipation: Secondary | ICD-10-CM | POA: Diagnosis not present

## 2023-03-22 DIAGNOSIS — G894 Chronic pain syndrome: Secondary | ICD-10-CM | POA: Diagnosis not present

## 2023-03-22 DIAGNOSIS — M4802 Spinal stenosis, cervical region: Secondary | ICD-10-CM | POA: Diagnosis not present

## 2023-03-22 DIAGNOSIS — R5381 Other malaise: Secondary | ICD-10-CM | POA: Diagnosis not present

## 2023-04-08 DIAGNOSIS — G959 Disease of spinal cord, unspecified: Secondary | ICD-10-CM | POA: Diagnosis not present

## 2023-04-08 DIAGNOSIS — M4802 Spinal stenosis, cervical region: Secondary | ICD-10-CM | POA: Diagnosis not present

## 2023-04-09 DIAGNOSIS — M25561 Pain in right knee: Secondary | ICD-10-CM | POA: Diagnosis not present

## 2023-04-09 DIAGNOSIS — M25562 Pain in left knee: Secondary | ICD-10-CM | POA: Diagnosis not present

## 2023-05-06 ENCOUNTER — Ambulatory Visit: Payer: No Typology Code available for payment source | Admitting: Cardiology

## 2023-05-08 DIAGNOSIS — G894 Chronic pain syndrome: Secondary | ICD-10-CM | POA: Diagnosis not present

## 2023-05-08 DIAGNOSIS — K5901 Slow transit constipation: Secondary | ICD-10-CM | POA: Diagnosis not present

## 2023-05-08 DIAGNOSIS — R5381 Other malaise: Secondary | ICD-10-CM | POA: Diagnosis not present

## 2023-05-08 DIAGNOSIS — M4802 Spinal stenosis, cervical region: Secondary | ICD-10-CM | POA: Diagnosis not present

## 2023-05-13 DIAGNOSIS — Z4789 Encounter for other orthopedic aftercare: Secondary | ICD-10-CM | POA: Diagnosis not present

## 2023-05-13 DIAGNOSIS — I10 Essential (primary) hypertension: Secondary | ICD-10-CM | POA: Diagnosis not present

## 2023-05-14 DIAGNOSIS — M4712 Other spondylosis with myelopathy, cervical region: Secondary | ICD-10-CM | POA: Diagnosis not present

## 2023-05-15 DIAGNOSIS — M4712 Other spondylosis with myelopathy, cervical region: Secondary | ICD-10-CM | POA: Diagnosis not present

## 2023-05-16 DIAGNOSIS — M4712 Other spondylosis with myelopathy, cervical region: Secondary | ICD-10-CM | POA: Diagnosis not present

## 2023-05-17 DIAGNOSIS — M4712 Other spondylosis with myelopathy, cervical region: Secondary | ICD-10-CM | POA: Diagnosis not present

## 2023-05-18 DIAGNOSIS — M4712 Other spondylosis with myelopathy, cervical region: Secondary | ICD-10-CM | POA: Diagnosis not present

## 2023-05-19 DIAGNOSIS — M4712 Other spondylosis with myelopathy, cervical region: Secondary | ICD-10-CM | POA: Diagnosis not present

## 2023-05-20 DIAGNOSIS — M4712 Other spondylosis with myelopathy, cervical region: Secondary | ICD-10-CM | POA: Diagnosis not present

## 2023-05-20 DIAGNOSIS — M6281 Muscle weakness (generalized): Secondary | ICD-10-CM | POA: Diagnosis not present

## 2023-05-20 DIAGNOSIS — E782 Mixed hyperlipidemia: Secondary | ICD-10-CM | POA: Diagnosis not present

## 2023-05-20 DIAGNOSIS — Z4789 Encounter for other orthopedic aftercare: Secondary | ICD-10-CM | POA: Diagnosis not present

## 2023-05-21 ENCOUNTER — Telehealth: Payer: Self-pay | Admitting: Family Medicine

## 2023-05-21 ENCOUNTER — Ambulatory Visit: Payer: Medicaid Other | Admitting: Family Medicine

## 2023-05-21 DIAGNOSIS — M4712 Other spondylosis with myelopathy, cervical region: Secondary | ICD-10-CM | POA: Diagnosis not present

## 2023-05-21 DIAGNOSIS — M4802 Spinal stenosis, cervical region: Secondary | ICD-10-CM | POA: Diagnosis not present

## 2023-05-21 NOTE — Telephone Encounter (Signed)
Copied from CRM [id number]. Topic: Clinical - Medical Advice >> May 21, 2023  2:15 PM Isabell A wrote: Reason for CRM: Gabriel Cook from [facility] care management dept calling to request.  Bedside commode with a drop arm Shower chair with back with no arm Wheel chair that can accomodate his weight Hospital bed  Callback number: [phone]

## 2023-05-22 DIAGNOSIS — M4712 Other spondylosis with myelopathy, cervical region: Secondary | ICD-10-CM | POA: Diagnosis not present

## 2023-05-23 ENCOUNTER — Ambulatory Visit: Payer: Medicaid Other | Admitting: Family Medicine

## 2023-05-23 DIAGNOSIS — M4712 Other spondylosis with myelopathy, cervical region: Secondary | ICD-10-CM | POA: Diagnosis not present

## 2023-05-24 ENCOUNTER — Telehealth: Payer: Self-pay | Admitting: Family Medicine

## 2023-05-24 DIAGNOSIS — M4712 Other spondylosis with myelopathy, cervical region: Secondary | ICD-10-CM | POA: Diagnosis not present

## 2023-05-24 NOTE — Telephone Encounter (Signed)
06/12/2022 same day cancel/transportation 05/21/2023 same day cancel 05/23/2023 same day cancel  Final warning sent via mail and mychart  Pt is rescheduled for 05/28/2023

## 2023-05-25 DIAGNOSIS — M4802 Spinal stenosis, cervical region: Secondary | ICD-10-CM | POA: Diagnosis not present

## 2023-05-25 DIAGNOSIS — M4712 Other spondylosis with myelopathy, cervical region: Secondary | ICD-10-CM | POA: Diagnosis not present

## 2023-05-26 DIAGNOSIS — M4712 Other spondylosis with myelopathy, cervical region: Secondary | ICD-10-CM | POA: Diagnosis not present

## 2023-05-27 DIAGNOSIS — M4712 Other spondylosis with myelopathy, cervical region: Secondary | ICD-10-CM | POA: Diagnosis not present

## 2023-05-28 ENCOUNTER — Ambulatory Visit: Payer: Medicaid Other | Admitting: Family Medicine

## 2023-05-28 ENCOUNTER — Telehealth: Payer: Self-pay | Admitting: Family Medicine

## 2023-05-28 DIAGNOSIS — M170 Bilateral primary osteoarthritis of knee: Secondary | ICD-10-CM

## 2023-05-28 DIAGNOSIS — R269 Unspecified abnormalities of gait and mobility: Secondary | ICD-10-CM

## 2023-05-28 DIAGNOSIS — I69398 Other sequelae of cerebral infarction: Secondary | ICD-10-CM

## 2023-05-28 DIAGNOSIS — Z6841 Body Mass Index (BMI) 40.0 and over, adult: Secondary | ICD-10-CM

## 2023-05-28 DIAGNOSIS — M4712 Other spondylosis with myelopathy, cervical region: Secondary | ICD-10-CM | POA: Diagnosis not present

## 2023-05-28 DIAGNOSIS — Q845 Enlarged and hypertrophic nails: Secondary | ICD-10-CM

## 2023-05-29 DIAGNOSIS — M4802 Spinal stenosis, cervical region: Secondary | ICD-10-CM | POA: Diagnosis not present

## 2023-05-29 DIAGNOSIS — M4712 Other spondylosis with myelopathy, cervical region: Secondary | ICD-10-CM | POA: Diagnosis not present

## 2023-05-29 NOTE — Telephone Encounter (Signed)
06/12/2022 same day cancellation/new patient visit/transportation 05/21/2023 same day cancellation/no reason documented 05/23/2023 same day cancellation/no reason documented 05/28/2023 no show  Dismissal generated

## 2023-05-30 NOTE — Telephone Encounter (Signed)
Copied from CRM [id number]. Topic: General - Other >> May 29, 2023 12:31 PM Eunice Blase wrote: Reason for CRM: Received call from [facility] per [facility] ph: [phone] do not carry shower chair and heavy duty wheel chair. Please redirect order.

## 2023-06-03 NOTE — Telephone Encounter (Signed)
I called and spoke with patient and he said that he does not know of any places that would care equipment and had thought a lady from insurance company would be working on this. He said that it has been 3 weeks since he has been out of the nursing facility. You can try sending an order to [facility] that is formerly [facility].

## 2023-06-04 DIAGNOSIS — I69398 Other sequelae of cerebral infarction: Secondary | ICD-10-CM | POA: Insufficient documentation

## 2023-06-04 DIAGNOSIS — R269 Unspecified abnormalities of gait and mobility: Secondary | ICD-10-CM | POA: Insufficient documentation

## 2023-06-04 NOTE — Telephone Encounter (Signed)
Dr. Doreene Burke,  We are responsible for 30 days from dismissal date to allow time for pt to schedule with a new PCP. Does pt need to be seen prior to ordering a wheelchair, shower chair, hospital bed, and bedside commode for him?

## 2023-06-04 NOTE — Telephone Encounter (Signed)
I called and spoke with patient that orders were place for  home medical equipment.

## 2023-06-11 DIAGNOSIS — Z993 Dependence on wheelchair: Secondary | ICD-10-CM | POA: Diagnosis not present

## 2023-06-11 DIAGNOSIS — I4891 Unspecified atrial fibrillation: Secondary | ICD-10-CM | POA: Diagnosis not present

## 2023-06-11 DIAGNOSIS — M4802 Spinal stenosis, cervical region: Secondary | ICD-10-CM | POA: Diagnosis not present

## 2023-06-11 DIAGNOSIS — G894 Chronic pain syndrome: Secondary | ICD-10-CM | POA: Diagnosis not present

## 2023-06-11 DIAGNOSIS — E119 Type 2 diabetes mellitus without complications: Secondary | ICD-10-CM | POA: Diagnosis not present

## 2023-06-11 DIAGNOSIS — I10 Essential (primary) hypertension: Secondary | ICD-10-CM | POA: Diagnosis not present

## 2023-06-11 DIAGNOSIS — Z87891 Personal history of nicotine dependence: Secondary | ICD-10-CM | POA: Diagnosis not present

## 2023-06-11 DIAGNOSIS — Z6835 Body mass index (BMI) 35.0-35.9, adult: Secondary | ICD-10-CM | POA: Diagnosis not present

## 2023-06-11 DIAGNOSIS — Z604 Social exclusion and rejection: Secondary | ICD-10-CM | POA: Diagnosis not present

## 2023-06-11 DIAGNOSIS — E782 Mixed hyperlipidemia: Secondary | ICD-10-CM | POA: Diagnosis not present

## 2023-06-11 DIAGNOSIS — Z981 Arthrodesis status: Secondary | ICD-10-CM | POA: Diagnosis not present

## 2023-06-12 DIAGNOSIS — G894 Chronic pain syndrome: Secondary | ICD-10-CM | POA: Diagnosis not present

## 2023-06-12 DIAGNOSIS — E119 Type 2 diabetes mellitus without complications: Secondary | ICD-10-CM | POA: Diagnosis not present

## 2023-06-12 DIAGNOSIS — Z6835 Body mass index (BMI) 35.0-35.9, adult: Secondary | ICD-10-CM | POA: Diagnosis not present

## 2023-06-12 DIAGNOSIS — Z87891 Personal history of nicotine dependence: Secondary | ICD-10-CM | POA: Diagnosis not present

## 2023-06-12 DIAGNOSIS — E782 Mixed hyperlipidemia: Secondary | ICD-10-CM | POA: Diagnosis not present

## 2023-06-12 DIAGNOSIS — M4802 Spinal stenosis, cervical region: Secondary | ICD-10-CM | POA: Diagnosis not present

## 2023-06-12 DIAGNOSIS — I10 Essential (primary) hypertension: Secondary | ICD-10-CM | POA: Diagnosis not present

## 2023-06-12 DIAGNOSIS — Z981 Arthrodesis status: Secondary | ICD-10-CM | POA: Diagnosis not present

## 2023-06-12 DIAGNOSIS — Z993 Dependence on wheelchair: Secondary | ICD-10-CM | POA: Diagnosis not present

## 2023-06-12 DIAGNOSIS — I4891 Unspecified atrial fibrillation: Secondary | ICD-10-CM | POA: Diagnosis not present

## 2023-06-12 DIAGNOSIS — Z604 Social exclusion and rejection: Secondary | ICD-10-CM | POA: Diagnosis not present

## 2023-06-27 DIAGNOSIS — Z981 Arthrodesis status: Secondary | ICD-10-CM | POA: Diagnosis not present

## 2023-06-27 DIAGNOSIS — Z993 Dependence on wheelchair: Secondary | ICD-10-CM | POA: Diagnosis not present

## 2023-06-27 DIAGNOSIS — M4802 Spinal stenosis, cervical region: Secondary | ICD-10-CM | POA: Diagnosis not present

## 2023-06-27 DIAGNOSIS — Z6835 Body mass index (BMI) 35.0-35.9, adult: Secondary | ICD-10-CM | POA: Diagnosis not present

## 2023-06-27 DIAGNOSIS — Z87891 Personal history of nicotine dependence: Secondary | ICD-10-CM | POA: Diagnosis not present

## 2023-06-27 DIAGNOSIS — I10 Essential (primary) hypertension: Secondary | ICD-10-CM | POA: Diagnosis not present

## 2023-06-27 DIAGNOSIS — E782 Mixed hyperlipidemia: Secondary | ICD-10-CM | POA: Diagnosis not present

## 2023-06-27 DIAGNOSIS — E119 Type 2 diabetes mellitus without complications: Secondary | ICD-10-CM | POA: Diagnosis not present

## 2023-06-27 DIAGNOSIS — Z604 Social exclusion and rejection: Secondary | ICD-10-CM | POA: Diagnosis not present

## 2023-06-27 DIAGNOSIS — I4891 Unspecified atrial fibrillation: Secondary | ICD-10-CM | POA: Diagnosis not present

## 2023-06-27 DIAGNOSIS — G894 Chronic pain syndrome: Secondary | ICD-10-CM | POA: Diagnosis not present

## 2023-07-03 DIAGNOSIS — I10 Essential (primary) hypertension: Secondary | ICD-10-CM | POA: Diagnosis not present

## 2023-07-03 DIAGNOSIS — M4802 Spinal stenosis, cervical region: Secondary | ICD-10-CM | POA: Diagnosis not present

## 2023-07-03 DIAGNOSIS — Z981 Arthrodesis status: Secondary | ICD-10-CM | POA: Diagnosis not present

## 2023-07-03 DIAGNOSIS — Z604 Social exclusion and rejection: Secondary | ICD-10-CM | POA: Diagnosis not present

## 2023-07-03 DIAGNOSIS — E782 Mixed hyperlipidemia: Secondary | ICD-10-CM | POA: Diagnosis not present

## 2023-07-03 DIAGNOSIS — Z993 Dependence on wheelchair: Secondary | ICD-10-CM | POA: Diagnosis not present

## 2023-07-03 DIAGNOSIS — I4891 Unspecified atrial fibrillation: Secondary | ICD-10-CM | POA: Diagnosis not present

## 2023-07-03 DIAGNOSIS — G894 Chronic pain syndrome: Secondary | ICD-10-CM | POA: Diagnosis not present

## 2023-07-03 DIAGNOSIS — Z87891 Personal history of nicotine dependence: Secondary | ICD-10-CM | POA: Diagnosis not present

## 2023-07-03 DIAGNOSIS — Z6835 Body mass index (BMI) 35.0-35.9, adult: Secondary | ICD-10-CM | POA: Diagnosis not present

## 2023-07-03 DIAGNOSIS — E119 Type 2 diabetes mellitus without complications: Secondary | ICD-10-CM | POA: Diagnosis not present

## 2023-07-04 ENCOUNTER — Ambulatory Visit: Payer: No Typology Code available for payment source | Attending: Cardiology | Admitting: Cardiology

## 2023-07-08 DIAGNOSIS — Z981 Arthrodesis status: Secondary | ICD-10-CM | POA: Diagnosis not present

## 2023-07-08 DIAGNOSIS — Z6835 Body mass index (BMI) 35.0-35.9, adult: Secondary | ICD-10-CM | POA: Diagnosis not present

## 2023-07-08 DIAGNOSIS — Z604 Social exclusion and rejection: Secondary | ICD-10-CM | POA: Diagnosis not present

## 2023-07-08 DIAGNOSIS — I10 Essential (primary) hypertension: Secondary | ICD-10-CM | POA: Diagnosis not present

## 2023-07-08 DIAGNOSIS — E782 Mixed hyperlipidemia: Secondary | ICD-10-CM | POA: Diagnosis not present

## 2023-07-08 DIAGNOSIS — Z993 Dependence on wheelchair: Secondary | ICD-10-CM | POA: Diagnosis not present

## 2023-07-08 DIAGNOSIS — E119 Type 2 diabetes mellitus without complications: Secondary | ICD-10-CM | POA: Diagnosis not present

## 2023-07-08 DIAGNOSIS — Z87891 Personal history of nicotine dependence: Secondary | ICD-10-CM | POA: Diagnosis not present

## 2023-07-08 DIAGNOSIS — G894 Chronic pain syndrome: Secondary | ICD-10-CM | POA: Diagnosis not present

## 2023-07-08 DIAGNOSIS — I4891 Unspecified atrial fibrillation: Secondary | ICD-10-CM | POA: Diagnosis not present

## 2023-07-08 DIAGNOSIS — M4802 Spinal stenosis, cervical region: Secondary | ICD-10-CM | POA: Diagnosis not present

## 2023-08-18 DIAGNOSIS — E782 Mixed hyperlipidemia: Secondary | ICD-10-CM | POA: Diagnosis not present

## 2023-08-18 DIAGNOSIS — Z4789 Encounter for other orthopedic aftercare: Secondary | ICD-10-CM | POA: Diagnosis not present

## 2023-08-18 DIAGNOSIS — M6281 Muscle weakness (generalized): Secondary | ICD-10-CM | POA: Diagnosis not present

## 2023-09-17 ENCOUNTER — Telehealth: Payer: Self-pay

## 2023-09-17 DIAGNOSIS — I10 Essential (primary) hypertension: Secondary | ICD-10-CM

## 2023-09-17 DIAGNOSIS — E1165 Type 2 diabetes mellitus with hyperglycemia: Secondary | ICD-10-CM

## 2023-09-17 DIAGNOSIS — I119 Hypertensive heart disease without heart failure: Secondary | ICD-10-CM

## 2023-09-19 ENCOUNTER — Telehealth: Payer: Self-pay

## 2023-09-19 NOTE — Progress Notes (Signed)
 Thank you for this referral. The [facility] team receives referrals for eligible patients: all patients with [facility] Primary Care Provider (any health plan including Medicaid or uninsured) and patients with a [facility] Primary Care Provider AND contracted health plan.     This patient does not have a [facility] or [facility] Primary Care Provider. [facility] cannot directly provide Care Management services to patients who do not have a qualifying Primary Care Provider. Please call us  if you need further clarification at [phone].    Patient given [facility] [phone] for additional support and instructed to updated insurance.  [facility]  [facility], [facility] Health Care Management Assistant Direct Dial: [phone]  Fax: [phone]

## 2023-10-17 ENCOUNTER — Telehealth: Payer: Self-pay | Admitting: *Deleted

## 2023-10-17 DIAGNOSIS — I10 Essential (primary) hypertension: Secondary | ICD-10-CM

## 2023-10-17 NOTE — Progress Notes (Unsigned)
 Complex Care Management Note Care Guide Note  10/17/2023 Name: Gabriel Cook MRN: [id number] DOB: 1971-03-18   Complex Care Management Outreach Attempts: An unsuccessful telephone outreach was attempted today to offer the patient information about available complex care management services.  Follow Up Plan:  Additional outreach attempts will be made to offer the patient complex care management information and services.   Encounter Outcome:  Patient Request to Call Back  Kandis Ormond, CMA [place]  [facility], [facility] Guide Direct Dial: [phone]  Fax: [phone] Website: [email or web address]

## 2023-10-18 NOTE — Progress Notes (Unsigned)
 Complex Care Management Note Care Guide Note  10/18/2023 Name: Gabriel Cook MRN: [id number] DOB: [birth year]   Complex Care Management Outreach Attempts: A second unsuccessful outreach was attempted today to offer the patient with information about available complex care management services.  Follow Up Plan:  Additional outreach attempts will be made to offer the patient complex care management information and services.   Encounter Outcome:  No Answer  Kandis Ormond, CMA [place]  [facility], [facility] Guide Direct Dial: [phone]  Fax: [phone] Website: [email or web address]

## 2023-10-21 NOTE — Progress Notes (Signed)
 Complex Care Management Note Care Guide Note  10/21/2023 Name: Gabriel Cook MRN: [id number] DOB: 12-11-1970   Complex Care Management Outreach Attempts: A third unsuccessful outreach was attempted today to offer the patient with information about available complex care management services.  Follow Up Plan:  No further outreach attempts will be made at this time. We have been unable to contact the patient to offer or enroll patient in complex care management services.  Encounter Outcome:  No Answer  Thedford Franks, CMA [place]  [facility], [facility] Guide Direct Dial: [phone]  Fax: [phone] Website: [email or web address]

## 2023-12-18 DIAGNOSIS — M6281 Muscle weakness (generalized): Secondary | ICD-10-CM | POA: Diagnosis not present

## 2023-12-18 DIAGNOSIS — E782 Mixed hyperlipidemia: Secondary | ICD-10-CM | POA: Diagnosis not present

## 2023-12-18 DIAGNOSIS — Z4789 Encounter for other orthopedic aftercare: Secondary | ICD-10-CM | POA: Diagnosis not present

## 2024-01-07 DIAGNOSIS — M25561 Pain in right knee: Secondary | ICD-10-CM | POA: Diagnosis not present

## 2024-01-07 DIAGNOSIS — M25562 Pain in left knee: Secondary | ICD-10-CM | POA: Diagnosis not present

## 2024-01-18 DIAGNOSIS — Z4789 Encounter for other orthopedic aftercare: Secondary | ICD-10-CM | POA: Diagnosis not present

## 2024-01-18 DIAGNOSIS — M6281 Muscle weakness (generalized): Secondary | ICD-10-CM | POA: Diagnosis not present

## 2024-01-18 DIAGNOSIS — E782 Mixed hyperlipidemia: Secondary | ICD-10-CM | POA: Diagnosis not present

## 2024-02-17 DIAGNOSIS — E782 Mixed hyperlipidemia: Secondary | ICD-10-CM | POA: Diagnosis not present

## 2024-02-17 DIAGNOSIS — Z4789 Encounter for other orthopedic aftercare: Secondary | ICD-10-CM | POA: Diagnosis not present

## 2024-02-17 DIAGNOSIS — M6281 Muscle weakness (generalized): Secondary | ICD-10-CM | POA: Diagnosis not present

## 2024-03-19 DIAGNOSIS — E782 Mixed hyperlipidemia: Secondary | ICD-10-CM | POA: Diagnosis not present

## 2024-03-19 DIAGNOSIS — M6281 Muscle weakness (generalized): Secondary | ICD-10-CM | POA: Diagnosis not present

## 2024-03-19 DIAGNOSIS — Z4789 Encounter for other orthopedic aftercare: Secondary | ICD-10-CM | POA: Diagnosis not present

## 2024-04-18 DIAGNOSIS — M6281 Muscle weakness (generalized): Secondary | ICD-10-CM | POA: Diagnosis not present

## 2024-04-18 DIAGNOSIS — E782 Mixed hyperlipidemia: Secondary | ICD-10-CM | POA: Diagnosis not present

## 2024-04-18 DIAGNOSIS — Z4789 Encounter for other orthopedic aftercare: Secondary | ICD-10-CM | POA: Diagnosis not present
